# Patient Record
Sex: Male | Born: 1985 | Race: Black or African American | Hispanic: Refuse to answer | Marital: Married | Smoking: Current some day smoker
Health system: Southern US, Community
[De-identification: ages and names within clinical notes are randomized; demographics above are authoritative.]

## PROBLEM LIST (undated history)

## (undated) DIAGNOSIS — E119 Type 2 diabetes mellitus without complications: Secondary | ICD-10-CM

## (undated) DIAGNOSIS — Z8249 Family history of ischemic heart disease and other diseases of the circulatory system: Secondary | ICD-10-CM

## (undated) HISTORY — DX: Family history of ischemic heart disease and other diseases of the circulatory system: Z82.49

---
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
## 2004-06-11 ENCOUNTER — Emergency Department (HOSPITAL_COMMUNITY): Admission: EM | Admit: 2004-06-11 | Discharge: 2004-06-11 | Payer: Self-pay | Admitting: *Deleted

## 2005-06-05 ENCOUNTER — Emergency Department (HOSPITAL_COMMUNITY): Admission: EM | Admit: 2005-06-05 | Discharge: 2005-06-05 | Payer: Self-pay | Admitting: Emergency Medicine

## 2005-06-17 ENCOUNTER — Emergency Department (HOSPITAL_COMMUNITY): Admission: EM | Admit: 2005-06-17 | Discharge: 2005-06-17 | Payer: Self-pay | Admitting: Emergency Medicine

## 2005-10-15 ENCOUNTER — Emergency Department (HOSPITAL_COMMUNITY): Admission: EM | Admit: 2005-10-15 | Discharge: 2005-10-15 | Payer: Self-pay | Admitting: Emergency Medicine

## 2006-03-18 ENCOUNTER — Emergency Department (HOSPITAL_COMMUNITY): Admission: EM | Admit: 2006-03-18 | Discharge: 2006-03-18 | Payer: Self-pay | Admitting: Emergency Medicine

## 2006-03-20 ENCOUNTER — Emergency Department (HOSPITAL_COMMUNITY): Admission: EM | Admit: 2006-03-20 | Discharge: 2006-03-20 | Payer: Self-pay | Admitting: Emergency Medicine

## 2006-05-20 ENCOUNTER — Emergency Department (HOSPITAL_COMMUNITY): Admission: EM | Admit: 2006-05-20 | Discharge: 2006-05-20 | Payer: Self-pay | Admitting: Emergency Medicine

## 2006-06-04 ENCOUNTER — Encounter: Admission: RE | Admit: 2006-06-04 | Discharge: 2006-09-02 | Payer: Self-pay | Admitting: Family Medicine

## 2006-09-03 ENCOUNTER — Emergency Department (HOSPITAL_COMMUNITY): Admission: EM | Admit: 2006-09-03 | Discharge: 2006-09-03 | Payer: Self-pay | Admitting: Emergency Medicine

## 2009-02-10 ENCOUNTER — Ambulatory Visit: Payer: Self-pay | Admitting: Family Medicine

## 2009-07-15 ENCOUNTER — Ambulatory Visit: Payer: Self-pay | Admitting: Family Medicine

## 2009-08-19 ENCOUNTER — Ambulatory Visit: Payer: Self-pay | Admitting: Family Medicine

## 2009-12-05 ENCOUNTER — Ambulatory Visit: Payer: Self-pay | Admitting: Family Medicine

## 2010-02-06 ENCOUNTER — Ambulatory Visit: Payer: Self-pay | Admitting: Family Medicine

## 2011-01-17 ENCOUNTER — Emergency Department (HOSPITAL_COMMUNITY)
Admission: EM | Admit: 2011-01-17 | Discharge: 2011-01-17 | Payer: Self-pay | Source: Home / Self Care | Admitting: Emergency Medicine

## 2011-02-22 ENCOUNTER — Encounter (INDEPENDENT_AMBULATORY_CARE_PROVIDER_SITE_OTHER): Payer: BC Managed Care – PPO | Admitting: Family Medicine

## 2011-08-13 ENCOUNTER — Ambulatory Visit: Payer: BC Managed Care – PPO | Admitting: Family Medicine

## 2011-08-16 ENCOUNTER — Encounter: Payer: Self-pay | Admitting: Family Medicine

## 2011-08-20 ENCOUNTER — Ambulatory Visit: Payer: BC Managed Care – PPO | Admitting: Family Medicine

## 2012-06-16 ENCOUNTER — Telehealth: Payer: Self-pay | Admitting: Internal Medicine

## 2012-06-16 NOTE — Telephone Encounter (Signed)
At him know that I don't know anybody down there.

## 2012-06-17 NOTE — Telephone Encounter (Signed)
Called pt to let them know Dr.Lalonde dosent know and family physicians in [place] left message

## 2012-07-14 ENCOUNTER — Encounter: Payer: Self-pay | Admitting: Family Medicine

## 2012-07-14 ENCOUNTER — Ambulatory Visit (INDEPENDENT_AMBULATORY_CARE_PROVIDER_SITE_OTHER): Payer: BC Managed Care – PPO | Admitting: Family Medicine

## 2012-07-14 VITALS — BP 130/82 | HR 67 | Ht 74.0 in | Wt 268.0 lb

## 2012-07-14 DIAGNOSIS — Z0000 Encounter for general adult medical examination without abnormal findings: Secondary | ICD-10-CM

## 2012-07-14 DIAGNOSIS — E669 Obesity, unspecified: Secondary | ICD-10-CM

## 2012-07-14 LAB — POCT URINALYSIS DIPSTICK
Bilirubin, UA: NEGATIVE
Blood, UA: NEGATIVE
Glucose, UA: NEGATIVE
Ketones, UA: NEGATIVE
Leukocytes, UA: NEGATIVE
Nitrite, UA: NEGATIVE
Protein, UA: NEGATIVE
Spec Grav, UA: 1.015
Urobilinogen, UA: NEGATIVE
pH, UA: 6

## 2012-07-14 LAB — LIPID PANEL
Cholesterol: 194 mg/dL (ref 0–200)
HDL: 28 mg/dL — ABNORMAL LOW (ref >39)
LDL Cholesterol: 130 mg/dL — ABNORMAL HIGH (ref 0–99)
Total CHOL/HDL Ratio: 6.9 Ratio
Triglycerides: 180 mg/dL — ABNORMAL HIGH (ref <150)
VLDL: 36 mg/dL (ref 0–40)

## 2012-07-14 NOTE — Progress Notes (Signed)
  Subjective:    Patient ID: Shawn [name], male    DOB: 1986/12/25, 26 y.o.   MRN: [id number]  HPI He is here for complete examination. He is now living in [place] with his girlfriend and her 32 year old child. He did this to see how the relationship ago. He is not happy living in [place]. He is considering going back to school. Realizes that just making money is not the ideal situation. He also has concerns over the relationship and obviously has been weaned work on it since he has moved. He does occasionally smoke.   Review of Systems Negative except as above    Objective:   Physical Exam BP 130/82  Pulse 67  Ht 6\' 2"  (1.88 m)  Wt 268 lb (121.564 kg)  BMI 34.41 kg/m2  SpO2 97%  General Appearance:    Alert, cooperative, no distress, appears stated age  Head:    Normocephalic, without obvious abnormality, atraumatic  Eyes:    PERRL, conjunctiva/corneas clear, EOM's intact, fundi    benign  Ears:    Normal TM's and external ear canals  Nose:   Nares normal, mucosa normal, no drainage or sinus   tenderness  Throat:   Lips, mucosa, and tongue normal; teeth and gums normal  Neck:   Supple, no lymphadenopathy;  thyroid:  no   enlargement/tenderness/nodules; no carotid   bruit or JVD  Back:    Spine nontender, no curvature, ROM normal, no CVA     tenderness  Lungs:     Clear to auscultation bilaterally without wheezes, rales or     ronchi; respirations unlabored  Chest Wall:    No tenderness or deformity   Heart:    Regular rate and rhythm, S1 and S2 normal, no murmur, rub   or gallop  Breast Exam:    No chest wall tenderness, masses or gynecomastia  Abdomen:     Soft, non-tender, nondistended, normoactive bowel sounds,    no masses, no hepatosplenomegaly  Genitalia:    Normal male external genitalia without lesions.  Testicles without masses.  No inguinal hernias.  Rectal:   Deferred due to age <40 and lack of symptoms  Extremities:   No clubbing, cyanosis or  edema  Pulses:   2+ and symmetric all extremities  Skin:   Skin color, texture, turgor normal, no rashes or lesions  Lymph nodes:   Cervical, supraclavicular, and axillary nodes normal  Neurologic:   CNII-XII intact, normal strength, sensation and gait; reflexes 2+ and symmetric throughout          Psych:   Normal mood, affect, hygiene and grooming.           Assessment & Plan:   1. Routine general medical examination at a health care facility  POCT Urinalysis Dipstick, Lipid panel  2. Obesity (BMI 30-39.9)     encouraged him to quit smoking entirely. I also encouraged him to get involved in an exercise program. We discussed him going back to school and I strongly encouraged him to do this as well as get involved in counseling to help resolve some interpersonal issues he is dealing with. All his questions were answered.

## 2016-01-01 ENCOUNTER — Encounter (HOSPITAL_COMMUNITY): Payer: Self-pay | Admitting: Emergency Medicine

## 2016-01-01 ENCOUNTER — Emergency Department (HOSPITAL_COMMUNITY): Payer: BLUE CROSS/BLUE SHIELD

## 2016-01-01 ENCOUNTER — Emergency Department (HOSPITAL_COMMUNITY)
Admission: EM | Admit: 2016-01-01 | Discharge: 2016-01-02 | Disposition: A | Discharge status: Home / Self Care | Payer: BLUE CROSS/BLUE SHIELD | Attending: Emergency Medicine | Admitting: Emergency Medicine

## 2016-01-01 DIAGNOSIS — K219 Gastro-esophageal reflux disease without esophagitis: Secondary | ICD-10-CM

## 2016-01-01 DIAGNOSIS — E119 Type 2 diabetes mellitus without complications: Secondary | ICD-10-CM | POA: Insufficient documentation

## 2016-01-01 DIAGNOSIS — Z79899 Other long term (current) drug therapy: Secondary | ICD-10-CM | POA: Diagnosis not present

## 2016-01-01 DIAGNOSIS — F17210 Nicotine dependence, cigarettes, uncomplicated: Secondary | ICD-10-CM | POA: Insufficient documentation

## 2016-01-01 DIAGNOSIS — R079 Chest pain, unspecified: Secondary | ICD-10-CM | POA: Diagnosis present

## 2016-01-01 HISTORY — DX: Type 2 diabetes mellitus without complications: E11.9

## 2016-01-01 LAB — BASIC METABOLIC PANEL
ANION GAP: 9 (ref 5–15)
BUN: 13 mg/dL (ref 6–20)
CHLORIDE: 104 mmol/L (ref 101–111)
CO2: 26 mmol/L (ref 22–32)
Calcium: 9.6 mg/dL (ref 8.9–10.3)
Creatinine, Ser: 0.98 mg/dL (ref 0.61–1.24)
GFR calc non Af Amer: >60 mL/min (ref >60)
Glucose, Bld: 124 mg/dL — ABNORMAL HIGH (ref 65–99)
POTASSIUM: 3.8 mmol/L (ref 3.5–5.1)
SODIUM: 139 mmol/L (ref 135–145)

## 2016-01-01 LAB — CBC
HEMATOCRIT: 35.4 % — AB (ref 39.0–52.0)
HEMOGLOBIN: 11.7 g/dL — AB (ref 13.0–17.0)
MCH: 31.3 pg (ref 26.0–34.0)
MCHC: 33.1 g/dL (ref 30.0–36.0)
MCV: 94.7 fL (ref 78.0–100.0)
PLATELETS: 294 10*3/uL (ref 150–400)
RBC: 3.74 MIL/uL — AB (ref 4.22–5.81)
RDW: 14.2 % (ref 11.5–15.5)
WBC: 6.4 10*3/uL (ref 4.0–10.5)

## 2016-01-01 LAB — I-STAT TROPONIN, ED: Troponin i, poc: 0 ng/mL (ref 0.00–0.08)

## 2016-01-01 LAB — CBG MONITORING, ED: Glucose-Capillary: 129 mg/dL — ABNORMAL HIGH (ref 65–99)

## 2016-01-01 NOTE — ED Notes (Signed)
MD at bedside. 

## 2016-01-01 NOTE — ED Notes (Signed)
EKG given to lead tech.  Could not locate MD.

## 2016-01-01 NOTE — ED Notes (Signed)
Pt states he is having chest pain that started yesterday   Pt states it has been on and off and today it came back and he felt shortness of breath with it and has felt very fatigued today  Pt states on the way here he started having tightness behind his eyes

## 2016-01-01 NOTE — ED Provider Notes (Signed)
CSN: [id number]     Arrival date & time 01/01/16  2054 History  By signing my name below, I, Soijett Blue, attest that this documentation has been prepared under the direction and in the presence of Ravin Denardo, MD. Electronically Signed: Soijett Blue, ED Scribe. 01/01/2016. 11:26 PM.   Chief Complaint  Patient presents with  . Chest Pain      Patient is a 30 y.o. male presenting with chest pain. The history is provided by the patient. No language interpreter was used.  Chest Pain Pain location:  Unable to specify Pain quality: burning   Pain radiates to:  Does not radiate Pain radiates to the back: no   Pain severity:  Mild Onset quality:  Sudden Duration:  1 day Timing:  Intermittent Progression:  Unchanged Chronicity:  New Context: eating   Relieved by:  None tried Worsened by:  Nothing tried Ineffective treatments:  Certain positions Associated symptoms: no anorexia, no fever, no lower extremity edema, no orthopnea, no palpitations, no PND, no shortness of breath and no syncope   Risk factors: diabetes mellitus     HPI Comments: Shawn [name] is a 30 y.o. male with a medical hx of heart dx and DM, who presents to the Emergency Department complaining of intermittent CP onset yesterday. He notes that he was laying down when the CP began and he had had an hamburger prior to the CP episode.  He states that he was informed in the past that he had GERD which he took TUMS that alleviated his symptoms. He denies using TUMS for his present symptoms. He reports that he has been burping and that it tasted like acid. He denies leg swelling and any other symptoms. He denies any recent long travel.    Past Medical History  Diagnosis Date  . FHx: heart disease   . Diabetes mellitus without complication (HCC)    History reviewed. No pertinent past surgical history. Family History  Problem Relation Age of Onset  . Diabetes Father   . Hypertension Father    Social History   Substance Use Topics  . Smoking status: Current Some Day Smoker    Types: Cigarettes  . Smokeless tobacco: None  . Alcohol Use: No    Review of Systems  Constitutional: Negative for fever.  Respiratory: Negative for shortness of breath.   Cardiovascular: Positive for chest pain. Negative for palpitations, orthopnea, syncope and PND.  Gastrointestinal: Negative for anorexia.  All other systems reviewed and are negative.     Allergies  Review of patient's allergies indicates no known allergies.  Home Medications   Prior to Admission medications   Medication Sig Start Date End Date Taking? Authorizing Provider  glipiZIDE (GLUCOTROL) 10 MG tablet Take 10 mg by mouth 2 (two) times daily before a meal.   Yes Historical Provider, MD  metFORMIN (GLUCOPHAGE) 500 MG tablet Take 500 mg by mouth 2 (two) times daily. 12/24/15  Yes Historical Provider, MD   BP 128/80 mmHg  Pulse 74  Temp(Src) 98.4 F (36.9 C) (Oral)  Resp 25  Ht 6\' 1"  (1.854 m)  Wt 269 lb (122.018 kg)  BMI 35.50 kg/m2  SpO2 99% Physical Exam  Constitutional: He is oriented to person, place, and time. He appears well-developed and well-nourished. No distress.  HENT:  Head: Normocephalic and atraumatic.  Mouth/Throat: Oropharynx is clear and moist.  Eyes: EOM are normal. Pupils are equal, round, and reactive to light.  Neck: Neck supple.  Cardiovascular: Normal rate, regular  rhythm and normal heart sounds.  Exam reveals no gallop and no friction rub.   No murmur heard. Pulmonary/Chest: Effort normal and breath sounds normal. No respiratory distress. He has no wheezes. He has no rales.  Abdominal: Soft. Bowel sounds are increased. There is no tenderness.  Musculoskeletal: Normal range of motion.  No palpable cords. All compartments are soft.   Neurological: He is alert and oriented to person, place, and time.  Skin: Skin is warm and dry.  Psychiatric: He has a normal mood and affect. His behavior is normal.   Nursing note and vitals reviewed.   ED Course  Procedures (including critical care time) DIAGNOSTIC STUDIES: Oxygen Saturation is 98% on RA, nl by my interpretation.    COORDINATION OF CARE: 11:24 PM Discussed treatment plan with pt at bedside which includes labs, CXR, EKG, and pt agreed to plan.    Labs Review Labs Reviewed  BASIC METABOLIC PANEL - Abnormal; Notable for the following:    Glucose, Bld 124 (*)    All other components within normal limits  CBC - Abnormal; Notable for the following:    RBC 3.74 (*)    Hemoglobin 11.7 (*)    HCT 35.4 (*)    All other components within normal limits  CBG MONITORING, ED - Abnormal; Notable for the following:    Glucose-Capillary 129 (*)    All other components within normal limits  I-STAT TROPOININ, ED    Imaging Review Dg Chest 2 View  01/01/2016  CLINICAL DATA:  Central and left-sided chest pain, onset yesterday. EXAM: CHEST  2 VIEW COMPARISON:  None. FINDINGS: The cardiomediastinal contours are normal. The lungs are clear. Pulmonary vasculature is normal. No consolidation, pleural effusion, or pneumothorax. No acute osseous abnormalities are seen. IMPRESSION: No acute pulmonary process. Electronically Signed   By: Rubye Oaks M.D.   On: 01/01/2016 22:36   I have personally reviewed and evaluated these images and lab results as part of my medical decision-making.   EKG Interpretation   Date/Time:  Sunday January 01 2016 21:03:40 EST Ventricular Rate:  76 PR Interval:  172 QRS Duration: 94 QT Interval:  357 QTC Calculation: 401 R Axis:   15 Text Interpretation:  Sinus rhythm Confirmed by [facility]  MD, Cederick Broadnax  ([id number]) on 01/01/2016 11:05:04 PM        MDM   Final diagnoses:  None   PERC score negative wells 0, highly doubt PE  > 8 hours of continuous symptoms with normal EKG and troponin excludes ACS.  Symptoms consistent with GERD, better with meds.  GERD friendly diet.  Prilosec.  Strict chest pain return  precautions given.      I personally performed the services described in this documentation, which was scribed in my presence. The recorded information has been reviewed and is accurate.     Tanmay Halteman, MD 01/02/16 0100

## 2016-01-02 ENCOUNTER — Encounter (HOSPITAL_COMMUNITY): Payer: Self-pay | Admitting: Emergency Medicine

## 2016-01-02 MED ORDER — FAMOTIDINE 20 MG PO TABS
20.0000 mg | ORAL_TABLET | Freq: Once | ORAL | Status: AC
  Administered 2016-01-02: 20 mg via ORAL
  Filled 2016-01-02: qty 1

## 2016-01-02 MED ORDER — GI COCKTAIL ~~LOC~~
30.0000 mL | Freq: Once | ORAL | Status: AC
  Administered 2016-01-02: 30 mL via ORAL
  Filled 2016-01-02: qty 30

## 2016-01-02 MED ORDER — OMEPRAZOLE 20 MG PO CPDR
20.0000 mg | DELAYED_RELEASE_CAPSULE | Freq: Every day | ORAL | Status: DC
Start: 2016-01-02 — End: 2022-11-06

## 2016-09-25 IMAGING — CR DG CHEST 2V
2 series · 2 of 2 positions shown · non-contrast
Comparison: None.

CLINICAL DATA: Central and left-sided chest pain, onset yesterday.

EXAM:
CHEST  2 VIEW

[w chest pa]
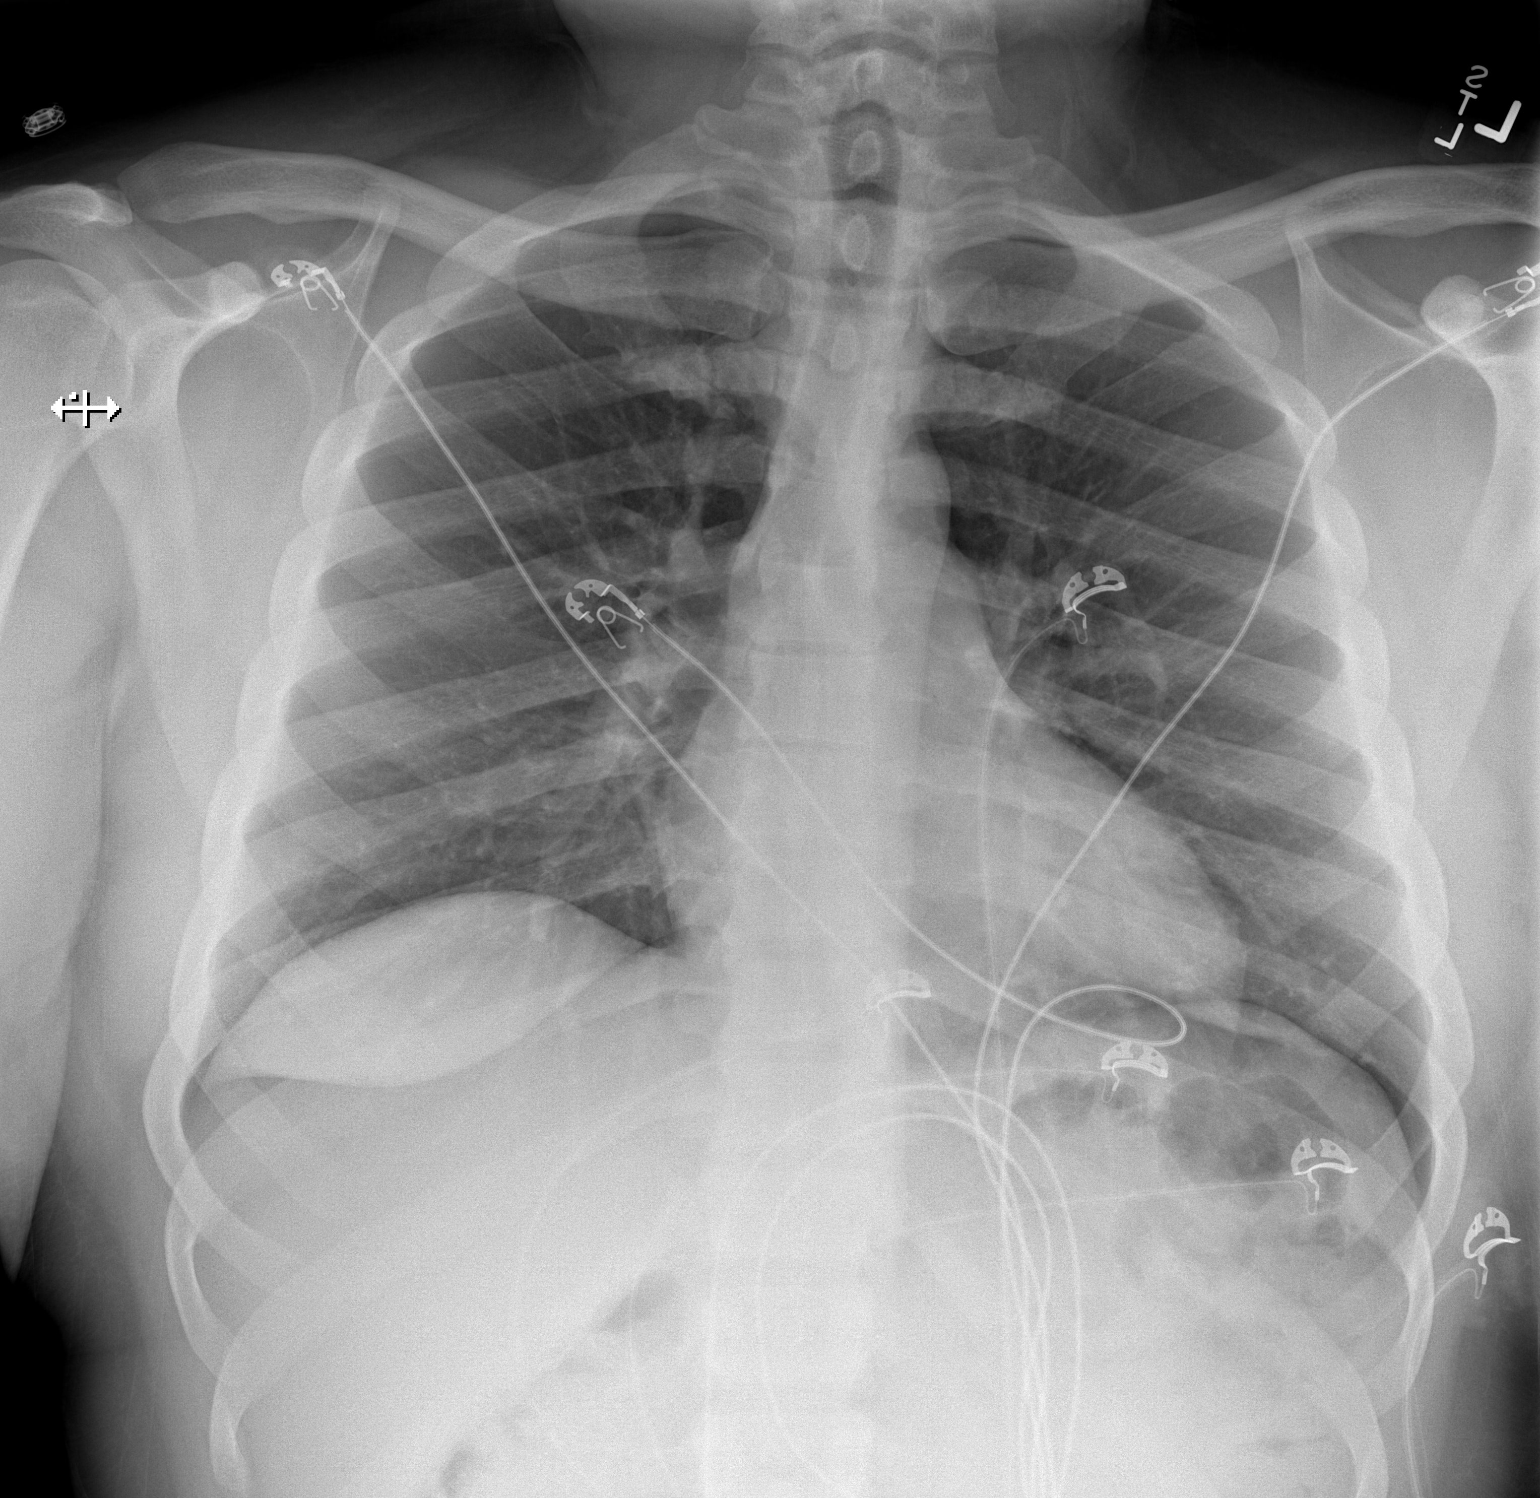

[w chest lat]
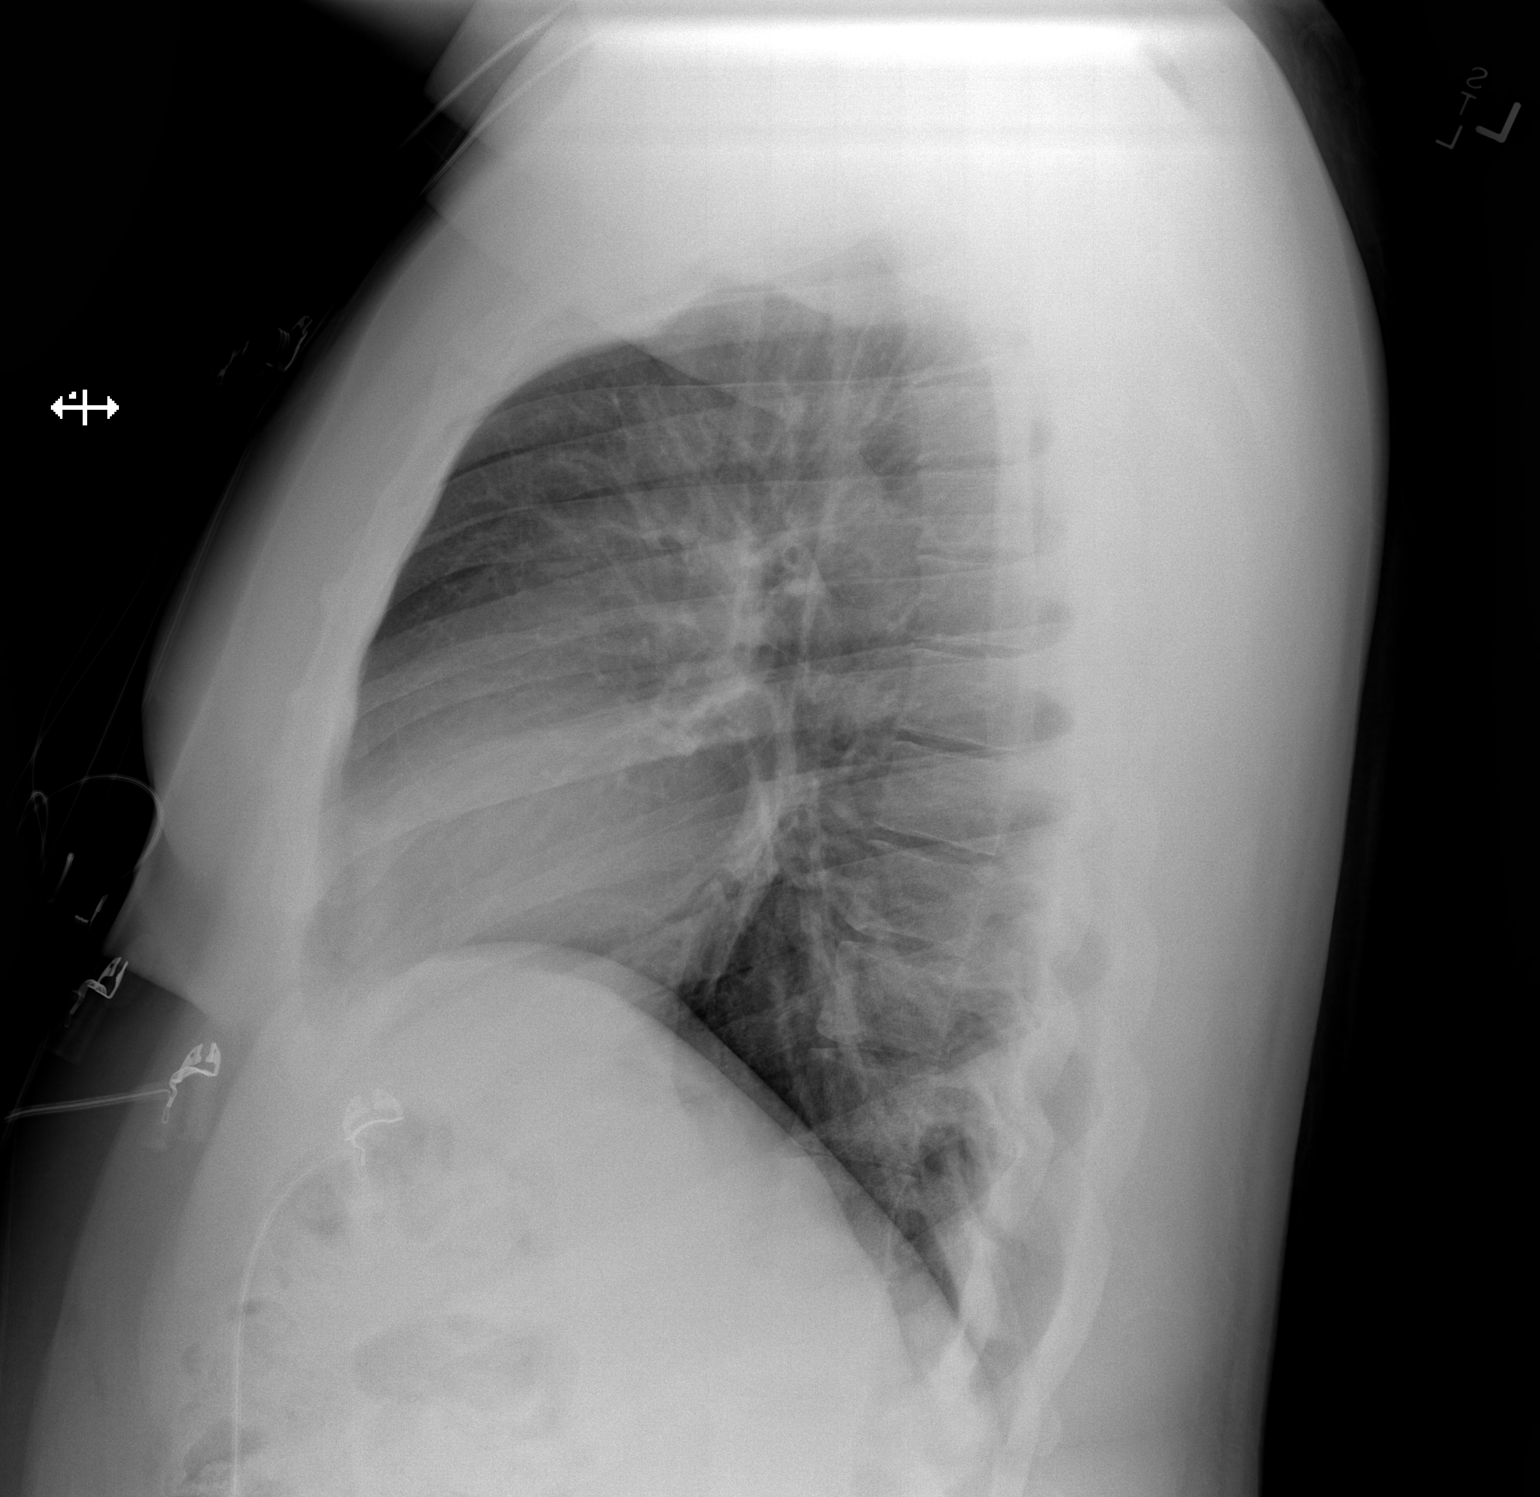

[2 of 2 positions shown; findings below may reference images not displayed]

FINDINGS: The cardiomediastinal contours are normal. The lungs are clear.
Pulmonary vasculature is normal. No consolidation, pleural effusion,
or pneumothorax. No acute osseous abnormalities are seen.
IMPRESSION: No acute pulmonary process.

## 2020-10-24 ENCOUNTER — Ambulatory Visit (HOSPITAL_COMMUNITY)
Admission: EM | Admit: 2020-10-24 | Discharge: 2020-10-24 | Disposition: A | Discharge status: Home / Self Care | Payer: No Typology Code available for payment source | Attending: Emergency Medicine | Admitting: Emergency Medicine

## 2020-10-24 ENCOUNTER — Other Ambulatory Visit: Payer: Self-pay

## 2020-10-24 ENCOUNTER — Encounter (HOSPITAL_COMMUNITY): Payer: Self-pay | Admitting: Emergency Medicine

## 2020-10-24 DIAGNOSIS — J029 Acute pharyngitis, unspecified: Secondary | ICD-10-CM | POA: Diagnosis not present

## 2020-10-24 DIAGNOSIS — Z7984 Long term (current) use of oral hypoglycemic drugs: Secondary | ICD-10-CM | POA: Insufficient documentation

## 2020-10-24 DIAGNOSIS — B349 Viral infection, unspecified: Secondary | ICD-10-CM | POA: Diagnosis not present

## 2020-10-24 DIAGNOSIS — R5383 Other fatigue: Secondary | ICD-10-CM | POA: Diagnosis not present

## 2020-10-24 DIAGNOSIS — R509 Fever, unspecified: Secondary | ICD-10-CM | POA: Diagnosis not present

## 2020-10-24 DIAGNOSIS — Z20822 Contact with and (suspected) exposure to covid-19: Secondary | ICD-10-CM | POA: Diagnosis not present

## 2020-10-24 DIAGNOSIS — Z79899 Other long term (current) drug therapy: Secondary | ICD-10-CM | POA: Insufficient documentation

## 2020-10-24 DIAGNOSIS — F17210 Nicotine dependence, cigarettes, uncomplicated: Secondary | ICD-10-CM | POA: Insufficient documentation

## 2020-10-24 DIAGNOSIS — R0602 Shortness of breath: Secondary | ICD-10-CM

## 2020-10-24 DIAGNOSIS — Z8616 Personal history of COVID-19: Secondary | ICD-10-CM | POA: Diagnosis not present

## 2020-10-24 DIAGNOSIS — E119 Type 2 diabetes mellitus without complications: Secondary | ICD-10-CM | POA: Insufficient documentation

## 2020-10-24 DIAGNOSIS — R6883 Chills (without fever): Secondary | ICD-10-CM | POA: Diagnosis not present

## 2020-10-24 DIAGNOSIS — Z794 Long term (current) use of insulin: Secondary | ICD-10-CM | POA: Insufficient documentation

## 2020-10-24 LAB — CBC WITH DIFFERENTIAL/PLATELET
Abs Immature Granulocytes: 0.06 10*3/uL (ref 0.00–0.07)
Basophils Absolute: 0 10*3/uL (ref 0.0–0.1)
Basophils Relative: 0 %
Eosinophils Absolute: 0 10*3/uL (ref 0.0–0.5)
Eosinophils Relative: 0 %
HCT: 39.8 % (ref 39.0–52.0)
Hemoglobin: 13.6 g/dL (ref 13.0–17.0)
Immature Granulocytes: 1 %
Lymphocytes Relative: 2 %
Lymphs Abs: 0.3 10*3/uL — ABNORMAL LOW (ref 0.7–4.0)
MCH: 30.8 pg (ref 26.0–34.0)
MCHC: 34.2 g/dL (ref 30.0–36.0)
MCV: 90.2 fL (ref 80.0–100.0)
Monocytes Absolute: 0.6 10*3/uL (ref 0.1–1.0)
Monocytes Relative: 4 %
Neutro Abs: 12 10*3/uL — ABNORMAL HIGH (ref 1.7–7.7)
Neutrophils Relative %: 93 %
Platelets: 280 10*3/uL (ref 150–400)
RBC: 4.41 MIL/uL (ref 4.22–5.81)
RDW: 11.9 % (ref 11.5–15.5)
WBC: 13 10*3/uL — ABNORMAL HIGH (ref 4.0–10.5)
nRBC: 0 % (ref 0.0–0.2)

## 2020-10-24 LAB — COMPREHENSIVE METABOLIC PANEL
ALT: 34 U/L (ref 0–44)
AST: 39 U/L (ref 15–41)
Albumin: 4.2 g/dL (ref 3.5–5.0)
Alkaline Phosphatase: 52 U/L (ref 38–126)
Anion gap: 15 (ref 5–15)
BUN: 9 mg/dL (ref 6–20)
CO2: 20 mmol/L — ABNORMAL LOW (ref 22–32)
Calcium: 9.5 mg/dL (ref 8.9–10.3)
Chloride: 98 mmol/L (ref 98–111)
Creatinine, Ser: 1.27 mg/dL — ABNORMAL HIGH (ref 0.61–1.24)
GFR, Estimated: >60 mL/min (ref >60)
Glucose, Bld: 303 mg/dL — ABNORMAL HIGH (ref 70–99)
Potassium: 4.2 mmol/L (ref 3.5–5.1)
Sodium: 133 mmol/L — ABNORMAL LOW (ref 135–145)
Total Bilirubin: 1.1 mg/dL (ref 0.3–1.2)
Total Protein: 6.6 g/dL (ref 6.5–8.1)

## 2020-10-24 LAB — POCT RAPID STREP A, ED / UC: Streptococcus, Group A Screen (Direct): NEGATIVE

## 2020-10-24 MED ORDER — IBUPROFEN 800 MG PO TABS: 800.0000 mg | ORAL_TABLET | Freq: Once | ORAL | Status: DC

## 2020-10-24 MED ORDER — ACETAMINOPHEN 325 MG PO TABS
650.0000 mg | ORAL_TABLET | Freq: Once | ORAL | Status: AC
  Administered 2020-10-24: 650 mg via ORAL

## 2020-10-24 MED ORDER — IBUPROFEN 800 MG PO TABS: 800.0000 mg | ORAL_TABLET | Freq: Three times a day (TID) | ORAL | 0 refills | Status: DC

## 2020-10-24 MED ORDER — ACETAMINOPHEN 325 MG PO TABS
ORAL_TABLET | ORAL | Status: AC
Start: 2020-10-24 — End: ?
  Filled 2020-10-24: qty 2

## 2020-10-24 NOTE — Discharge Instructions (Signed)
Strep test negative Test for Covid/RSV/flu pending Alternate Tylenol and ibuprofen every 4 hours for better control of fever/body aches and headache associated Rest and drink plenty of fluids  Follow-up if any symptoms not improving or worsening

## 2020-10-24 NOTE — ED Triage Notes (Signed)
Pt c/o bodyaches, fatigue, chills, sore throat onset today. Pt states his kids have been sick as well. Pt states he had covid at the end of July and had lingering symptoms up until about 2 weeks ago.

## 2020-10-25 LAB — RESP PANEL BY RT PCR (RSV, FLU A&B, COVID)
Influenza A by PCR: NEGATIVE
Influenza B by PCR: NEGATIVE
Respiratory Syncytial Virus by PCR: NEGATIVE
SARS Coronavirus 2 by RT PCR: NEGATIVE

## 2020-10-25 LAB — CULTURE, GROUP A STREP (THRC)

## 2020-10-25 NOTE — ED Provider Notes (Signed)
[facility]    CSN: [id number] Arrival date & time: 10/24/20  1025      History   Chief Complaint Chief Complaint  Patient presents with  . Sore Throat  . Fatigue  . Chills  . Shortness of Breath    HPI Shawn [name] is a 34 y.o. male history of DM type [name], presenting today for evaluation of fevers chills and body aches.  Patient reports that beginning today he all of a sudden felt under the weather and has had a lot of fatigue and body aches.  Is also reports sore throat.  Reports similar symptoms when he previously had Covid approximately 2 to 3 months ago.  Does report other family members have been sick recently as well.  He denies any chest pain shortness of breath, cough.  He does report some mild right abdominal pain and some diarrhea recently. Expresses concern over use of Metformin with concomitant alcohol use, concerned as he has not followed up with primary care in a while.  HPI  Past Medical History:  Diagnosis Date  . Diabetes mellitus without complication (HCC)   . FHx: heart disease     Patient Active Problem List   Diagnosis Date Noted  . Obesity (BMI 30-39.9) 07/14/2012    History reviewed. No pertinent surgical history.     Home Medications    Prior to Admission medications   Medication Sig Start Date End Date Taking? Authorizing Provider  insulin lispro (ADMELOG SOLOSTAR) 100 UNIT/ML KwikPen Inject into the skin. 08/06/20  Yes [provider]  Alogliptin-metFORMIN HCl 12.04-999 MG TABS TAKE 1 TABLET BY MOUTH TWICE DAILY WITH A MEAL 10/14/20   [provider]  glipiZIDE (GLUCOTROL) 10 MG tablet Take 10 mg by mouth 2 (two) times daily before a meal.    [provider]  ibuprofen (ADVIL) 800 MG tablet Take 1 tablet (800 mg total) by mouth 3 (three) times daily. 10/24/20   Gwenna Fuston C, PA-C  Insulin Glargine (BASAGLAR KWIKPEN) 100 UNIT/ML INJECT 30 UNITS UNDER THE SKIN TWO TIMES A DAY 07/06/20   [provider]  metFORMIN (GLUCOPHAGE) 500 MG tablet Take 500 mg by mouth 2 (two) times daily. 12/24/15   [provider]  omeprazole (PRILOSEC) 20 MG capsule Take 1 capsule (20 mg total) by mouth daily. 01/02/16   Palumbo, April, MD    Family History Family History  Problem Relation Age of Onset  . Diabetes Father   . Hypertension Father     Social History Social History   Tobacco Use  . Smoking status: Current Some Day Smoker    Types: Cigarettes  . Smokeless tobacco: Never Used  Substance Use Topics  . Alcohol use: No  . Drug use: No     Allergies   Patient has no known allergies.   Review of Systems Review of Systems  Constitutional: Positive for chills, fatigue and fever. Negative for activity change and appetite change.  HENT: Positive for sore throat. Negative for congestion, ear pain, rhinorrhea, sinus pressure and trouble swallowing.   Eyes: Negative for discharge and redness.  Respiratory: Negative for cough, chest tightness and shortness of breath.   Cardiovascular: Negative for chest pain.  Gastrointestinal: Negative for abdominal pain, diarrhea, nausea and vomiting.  Musculoskeletal: Positive for myalgias.  Skin: Negative for rash.  Neurological: Negative for dizziness, light-headedness and headaches.     Physical Exam Triage Vital Signs ED Triage Vitals  Enc Vitals Group  BP --      Pulse Rate 10/24/20 2000 (!) 112     Resp 10/24/20 2000 16     Temp 10/24/20 2000 (!) 102.8 F (39.3 C)     Temp Source 10/24/20 2000 Oral     SpO2 10/24/20 2000 100 %     Weight --      Height --      Head Circumference --      Peak Flow --      Pain Score 10/24/20 1957 10     Pain Loc --      Pain Edu? --      Excl. in GC? --    No data found.  Updated Vital Signs Pulse (!) 112   Temp (!) 102.8 F (39.3 C) (Oral)   Resp 16   SpO2 100%   Visual Acuity Right Eye Distance:   Left Eye Distance:   Bilateral Distance:    Right Eye Near:    Left Eye Near:    Bilateral Near:     Physical Exam Vitals and nursing note reviewed.  Constitutional:      Appearance: He is well-developed.     Comments: Slightly diaphoretic  HENT:     Head: Normocephalic and atraumatic.     Ears:     Comments: Bilateral ears without tenderness to palpation of external auricle, tragus and mastoid, EAC's without erythema or swelling, TM's with good bony landmarks and cone of light. Non erythematous.     Nose: Nose normal.     Mouth/Throat:     Comments: Oral mucosa pink and moist, no tonsillar enlargement or exudate. Posterior pharynx patent and nonerythematous, no uvula deviation or swelling. Normal phonation. Eyes:     Conjunctiva/sclera: Conjunctivae normal.  Cardiovascular:     Rate and Rhythm: Normal rate.  Pulmonary:     Effort: Pulmonary effort is normal. No respiratory distress.     Comments: Breathing comfortably at rest, CTABL, no wheezing, rales or other adventitious sounds auscultated Abdominal:     General: There is no distension.  Musculoskeletal:        General: Normal range of motion.     Cervical back: Neck supple.  Skin:    General: Skin is warm and dry.  Neurological:     Mental Status: He is alert and oriented to person, place, and time.      UC Treatments / Results  Labs (all labs ordered are listed, but only abnormal results are displayed) Labs Reviewed  CBC WITH DIFFERENTIAL/PLATELET - Abnormal; Notable for the following components:      Result Value   WBC 13.0 (*)    Neutro Abs 12.0 (*)    Lymphs Abs 0.3 (*)    All other components within normal limits  COMPREHENSIVE METABOLIC PANEL - Abnormal; Notable for the following components:   Sodium 133 (*)    CO2 20 (*)    Glucose, Bld 303 (*)    Creatinine, Ser 1.27 (*)    All other components within normal limits  CULTURE, GROUP A STREP ([facility])  RESP PANEL BY RT PCR (RSV, FLU A&B, COVID)  POCT RAPID STREP A, ED / UC    EKG   Radiology No results  found.  Procedures Procedures (including critical care time)  Medications Ordered in UC Medications  acetaminophen (TYLENOL) tablet 650 mg (650 mg Oral Given 10/24/20 2032)    Initial Impression / Assessment and Plan / UC Course  I have reviewed the triage vital signs  and the nursing notes.  Pertinent labs & imaging results that were available during my care of the patient were reviewed by me and considered in my medical decision making (see chart for details).     1 day of fever tachycardia; strep test negative, respiratory panel pending to be screened for Covid RSV and flu.  Lungs clear to auscultation and without cough or any lower respiratory symptoms, low suspicion of pneumonia.  Recommending treatment as viral illness with close monitoring over the next 24 to 48 hours.  Rest and fluids.  Checking CBC and CMP given patient's concern in relation to taking Metformin, will call with results if altering plan.  Otherwise follow-up with primary care as planned later this week.  Discussed strict return precautions. Patient verbalized understanding and is agreeable with plan.  Final Clinical Impressions(s) / UC Diagnoses   Final diagnoses:  Fever, unspecified  Fatigue, unspecified type  Viral illness     Discharge Instructions     Strep test negative Test for Covid/RSV/flu pending Alternate Tylenol and ibuprofen every 4 hours for better control of fever/body aches and headache associated Rest and drink plenty of fluids  Follow-up if any symptoms not improving or worsening   ED Prescriptions    Medication Sig Dispense Auth. Provider   ibuprofen (ADVIL) 800 MG tablet Take 1 tablet (800 mg total) by mouth 3 (three) times daily. 21 tablet Dawt Reeb, [place] C, PA-C     PDMP not reviewed this encounter.   Lew Dawes, [place] 10/25/20 [phone]

## 2020-10-27 LAB — CULTURE, GROUP A STREP (THRC)

## 2021-08-10 ENCOUNTER — Ambulatory Visit: Payer: No Typology Code available for payment source

## 2022-02-08 ENCOUNTER — Telehealth: Payer: Self-pay | Admitting: Emergency Medicine

## 2022-02-08 ENCOUNTER — Other Ambulatory Visit: Payer: Self-pay

## 2022-02-08 ENCOUNTER — Ambulatory Visit
Admission: EM | Admit: 2022-02-08 | Discharge: 2022-02-08 | Disposition: A | Discharge status: Home / Self Care | Payer: No Typology Code available for payment source | Attending: Emergency Medicine | Admitting: Emergency Medicine

## 2022-02-08 DIAGNOSIS — Z794 Long term (current) use of insulin: Secondary | ICD-10-CM

## 2022-02-08 DIAGNOSIS — I10 Essential (primary) hypertension: Secondary | ICD-10-CM

## 2022-02-08 DIAGNOSIS — E1159 Type 2 diabetes mellitus with other circulatory complications: Secondary | ICD-10-CM

## 2022-02-08 DIAGNOSIS — E1165 Type 2 diabetes mellitus with hyperglycemia: Secondary | ICD-10-CM

## 2022-02-08 MED ORDER — HYDROCHLOROTHIAZIDE 25 MG PO TABS: 25.0000 mg | ORAL_TABLET | Freq: Every morning | ORAL | 0 refills | Status: DC

## 2022-02-08 MED ORDER — HYDROCHLOROTHIAZIDE 25 MG PO TABS
25.0000 mg | ORAL_TABLET | Freq: Every morning | ORAL | 0 refills | Status: DC
Start: 2022-02-08 — End: 2022-02-08

## 2022-02-08 MED ORDER — METFORMIN HCL 500 MG PO TABS: 500.0000 mg | ORAL_TABLET | Freq: Two times a day (BID) | ORAL | 0 refills | Status: DC

## 2022-02-08 MED ORDER — LISINOPRIL 5 MG PO TABS: 5.0000 mg | ORAL_TABLET | Freq: Every evening | ORAL | 0 refills | Status: AC

## 2022-02-08 MED ORDER — GLIMEPIRIDE 4 MG PO TABS: 4.0000 mg | ORAL_TABLET | Freq: Every day | ORAL | 0 refills | Status: DC

## 2022-02-08 MED ORDER — TRESIBA FLEXTOUCH 200 UNIT/ML ~~LOC~~ SOPN: 60.0000 [IU] | PEN_INJECTOR | Freq: Every day | SUBCUTANEOUS | 2 refills | Status: AC

## 2022-02-08 MED ORDER — TRESIBA FLEXTOUCH 200 UNIT/ML ~~LOC~~ SOPN: 60.0000 [IU] | PEN_INJECTOR | Freq: Every day | SUBCUTANEOUS | 2 refills | Status: DC

## 2022-02-08 MED ORDER — LISINOPRIL 5 MG PO TABS
5.0000 mg | ORAL_TABLET | Freq: Every evening | ORAL | 0 refills | Status: DC
Start: 2022-02-08 — End: 2022-02-08

## 2022-02-08 MED ORDER — BD PEN NEEDLE MICRO U/F 32G X 6 MM MISC: 1.0000 [IU] | Freq: Every day | 2 refills | Status: DC

## 2022-02-08 NOTE — ED Triage Notes (Addendum)
Pt reports moving here recently and needing a PCP. Patient states he needs medication refills on medications for diabetes. Pt reports having blood glucose levels up to 400s. This morning he reports it was 347.

## 2022-02-08 NOTE — Telephone Encounter (Signed)
PAtient requested medications be sent to a different pharmacy, resent to one he requested and made it favorite in chart

## 2022-02-08 NOTE — Discharge Instructions (Addendum)
Please begin Tresiba, 60 units daily.  Please see below for instructions how to obtain $99 insulin card for this insulin.  This insulin is far superior to Lantus and you will find that you have much better sugar control throughout the course of your day.  It also means that you only had to inject yourself once.  I have renewed your prescription for metformin, please take 1 tablet twice daily with your meals.  I provided you with a coupon for this prescription.  Please discontinue glipizide and begin glimepiride, I believe this is a much more beneficial medication and it only needs to be taken once daily, I provided you with a coupon for this prescription.  For your blood pressure, please begin taking hydrochlorothiazide in the morning and lisinopril in the evening.  I provided you with coupons for both of these medications.  There is a great app that you to download to your phone, see below.  This will help you manage your diabetes gave you lots of great tips and advice and keep you motivated.  I have included the information for [facility] and adult and pediatric specialists, please reach out to them to schedule appointment as soon as possible for follow-up of your type 2 diabetes.  Stay motivated, stay focused on the future and get out of the blame game.  None of [place] are perfect, we can only strive to be.  [employer] Personalized Diabetes Management Platform   Pen, Smart Button, App ([email or web address])   If you are uninsured or have commercial insurance and want to register via text to receive your My$99Insulin Card, text [email or web address] to [phone] to sign up.

## 2022-02-08 NOTE — ED Provider Notes (Signed)
[facility]    CSN: [id number] Arrival date & time: 02/08/22  [id number]    HISTORY   Chief Complaint  Patient presents with   Medication Refill   HPI Shawn [name] is a 36 y.o. male. Pt reports moving here recently and needing a PCP. Patient states he needs medication refills on medications for diabetes. Pt reports having blood glucose levels up to 400s. This morning he reports it was 347.  Patient states his efforts to control his diabetes wax and wane.  Patient states he is out of all medications at this time.  Patient says he does not currently have any insurance.  Patient denies vision changes, headache, erectile dysfunction, gastroparesis, wounds to extremities.  The history is provided by the patient.  Past Medical History:  Diagnosis Date   Diabetes mellitus without complication (HCC)    FHx: heart disease    Patient Active Problem List   Diagnosis Date Noted   Obesity (BMI 30-39.9) 07/14/2012   History reviewed. No pertinent surgical history.  Home Medications    Prior to Admission medications   Medication Sig Start Date End Date Taking? Authorizing Provider  Insulin Pen Needle (BD PEN NEEDLE MICRO U/F) 32G X 6 MM MISC 1 Units by Does not apply route daily. 02/08/22 05/19/22 Yes Theadora RamaMorgan, Abhi Moccia Scales, PA-C  glimepiride (AMARYL) 4 MG tablet Take 1 tablet (4 mg total) by mouth daily with breakfast. 02/08/22 05/09/22  Theadora RamaMorgan, Kailany Dinunzio Scales, PA-C  glipiZIDE (GLUCOTROL) 10 MG tablet Take 10 mg by mouth 2 (two) times daily before a meal.    [provider]  hydrochlorothiazide (HYDRODIURIL) 25 MG tablet Take 1 tablet (25 mg total) by mouth in the morning. 02/08/22 05/09/22  Theadora RamaMorgan, Dashia Caldeira Scales, PA-C  ibuprofen (ADVIL) 800 MG tablet Take 1 tablet (800 mg total) by mouth 3 (three) times daily. 10/24/20   Wieters, Hallie C, PA-C  insulin degludec (TRESIBA FLEXTOUCH) 200 UNIT/ML FlexTouch Pen Inject 60 Units into the skin daily before breakfast. 02/08/22 05/09/22   Theadora RamaMorgan, Arvind Mexicano Scales, PA-C  lisinopril (ZESTRIL) 5 MG tablet Take 1 tablet (5 mg total) by mouth at bedtime. 02/08/22 05/09/22  Theadora RamaMorgan, Tyrus Wilms Scales, PA-C  metFORMIN (GLUCOPHAGE) 500 MG tablet Take 1 tablet (500 mg total) by mouth 2 (two) times daily with a meal. 02/08/22 05/09/22  Theadora RamaMorgan, Lexy Meininger Scales, PA-C  omeprazole (PRILOSEC) 20 MG capsule Take 1 capsule (20 mg total) by mouth daily. 01/02/16   Palumbo, April, MD    Family History Family History  Problem Relation Age of Onset   Diabetes Father    Hypertension Father    Social History Social History   Tobacco Use   Smoking status: Some Days    Types: Cigarettes   Smokeless tobacco: Never  Substance Use Topics   Alcohol use: No   Drug use: No   Allergies   Patient has no known allergies.  Review of Systems Review of Systems Pertinent findings noted in history of present illness.   Physical Exam Triage Vital Signs ED Triage Vitals  Enc Vitals Group     BP 10/27/21 0827 (!) 147/82     Pulse Rate 10/27/21 0827 72     Resp 10/27/21 0827 18     Temp 10/27/21 0827 98.3 F (36.8 C)     Temp Source 10/27/21 0827 Oral     SpO2 10/27/21 0827 98 %     Weight --      Height --      Head Circumference --  Peak Flow --      Pain Score 10/27/21 0826 5     Pain Loc --      Pain Edu? --      Excl. in GC? --   No data found.  Updated Vital Signs BP (!) 130/100 (BP Location: Right Arm)    Pulse 71    Temp 98.8 F (37.1 C) (Oral)    Resp 20    Wt 255 lb 9.6 oz (115.9 kg)    SpO2 97%    BMI 33.72 kg/m   Physical Exam Vitals and nursing note reviewed.  Constitutional:      General: He is not in acute distress.    Appearance: Normal appearance. He is not ill-appearing.  HENT:     Head: Normocephalic and atraumatic.     Salivary Glands: Right salivary gland is not diffusely enlarged or tender. Left salivary gland is not diffusely enlarged or tender.     Right Ear: Tympanic membrane, ear canal and external ear normal. No  drainage. No middle ear effusion. There is no impacted cerumen. Tympanic membrane is not erythematous or bulging.     Left Ear: Tympanic membrane, ear canal and external ear normal. No drainage.  No middle ear effusion. There is no impacted cerumen. Tympanic membrane is not erythematous or bulging.     Nose: Nose normal. No nasal deformity, septal deviation, mucosal edema, congestion or rhinorrhea.     Right Turbinates: Not enlarged, swollen or pale.     Left Turbinates: Not enlarged, swollen or pale.     Right Sinus: No maxillary sinus tenderness or frontal sinus tenderness.     Left Sinus: No maxillary sinus tenderness or frontal sinus tenderness.     Mouth/Throat:     Lips: Pink. No lesions.     Mouth: Mucous membranes are moist. No oral lesions.     Pharynx: Oropharynx is clear. Uvula midline. No posterior oropharyngeal erythema or uvula swelling.     Tonsils: No tonsillar exudate. 0 on the right. 0 on the left.  Eyes:     General: Lids are normal.        Right eye: No discharge.        Left eye: No discharge.     Extraocular Movements: Extraocular movements intact.     Conjunctiva/sclera: Conjunctivae normal.     Right eye: Right conjunctiva is not injected.     Left eye: Left conjunctiva is not injected.  Neck:     Trachea: Trachea and phonation normal.  Cardiovascular:     Rate and Rhythm: Normal rate and regular rhythm.     Pulses: Normal pulses.     Heart sounds: Normal heart sounds. No murmur heard.   No friction rub. No gallop.  Pulmonary:     Effort: Pulmonary effort is normal. No accessory muscle usage, prolonged expiration or respiratory distress.     Breath sounds: Normal breath sounds. No stridor, decreased air movement or transmitted upper airway sounds. No decreased breath sounds, wheezing, rhonchi or rales.  Chest:     Chest wall: No tenderness.  Abdominal:     General: Abdomen is flat. Bowel sounds are normal.     Palpations: Abdomen is soft.  Musculoskeletal:         General: Normal range of motion.     Cervical back: Normal range of motion and neck supple. Normal range of motion.  Lymphadenopathy:     Cervical: No cervical adenopathy.  Skin:    General: Skin  is warm and dry.     Findings: No erythema or rash.  Neurological:     General: No focal deficit present.     Mental Status: He is alert and oriented to person, place, and time.  Psychiatric:        Mood and Affect: Mood normal.        Behavior: Behavior normal.    Visual Acuity Right Eye Distance:   Left Eye Distance:   Bilateral Distance:    Right Eye Near:   Left Eye Near:    Bilateral Near:     UC Couse / Diagnostics / Procedures:    EKG  Radiology No results found.  Procedures Procedures (including critical care time)  UC Diagnoses / Final Clinical Impressions(s)   I have reviewed the triage vital signs and the nursing notes.  Pertinent labs & imaging results that were available during my care of the patient were reviewed by me and considered in my medical decision making (see chart for details).    Final diagnoses:  Type 2 diabetes mellitus with hyperglycemia, with long-term current use of insulin (HCC)  Essential hypertension   Patient provided with renewal of his diabetes medications along with antihypertensive medications which have not been prescribed for him to date.  Education provided regarding type 2 diabetes, side effects of poorly controlled diabetes and current diabetes guidelines.  Patient provided with a app for his phone to help keep him motivated and give him good ideas about how to manage his type 2 diabetes.  Return precautions advised.  Patient provided with information about a local federally qualified Health Center who will see him without health insurance.  ED Prescriptions     Medication Sig Dispense Auth. Provider   metFORMIN (GLUCOPHAGE) 500 MG tablet Take 1 tablet (500 mg total) by mouth 2 (two) times daily with a meal. 180 tablet Theadora Rama Scales, PA-C   insulin degludec (TRESIBA FLEXTOUCH) 200 UNIT/ML FlexTouch Pen Inject 60 Units into the skin daily before breakfast. 1,800 mL Theadora Rama Scales, PA-C   Insulin Pen Needle (BD PEN NEEDLE MICRO U/F) 32G X 6 MM MISC 1 Units by Does not apply route daily. 100 each Theadora Rama Scales, PA-C   glimepiride (AMARYL) 4 MG tablet Take 1 tablet (4 mg total) by mouth daily with breakfast. 90 tablet Theadora Rama Scales, PA-C   hydrochlorothiazide (HYDRODIURIL) 25 MG tablet Take 1 tablet (25 mg total) by mouth in the morning. 90 tablet Theadora Rama Scales, PA-C   lisinopril (ZESTRIL) 5 MG tablet Take 1 tablet (5 mg total) by mouth at bedtime. 90 tablet Theadora Rama Scales, [place]      PDMP not reviewed this encounter.  Pending results:  Labs Reviewed - No data to display  Medications Ordered in UC: Medications - No data to display  Disposition Upon Discharge:  Condition: stable for discharge home Home: take medications as prescribed; routine discharge instructions as discussed; follow up as advised.  Patient presented with an acute illness with associated systemic symptoms and significant discomfort requiring urgent management. In my opinion, this is a condition that a prudent lay person (someone who possesses an average knowledge of health and medicine) may potentially expect to result in complications if not addressed urgently such as respiratory distress, impairment of bodily function or dysfunction of bodily organs.   Routine symptom specific, illness specific and/or disease specific instructions were discussed with the patient and/or caregiver at length.   As such, the patient has been evaluated and  assessed, work-up was performed and treatment was provided in alignment with urgent care protocols and evidence based medicine.  Patient/parent/caregiver has been advised that the patient may require follow up for further testing and treatment if the symptoms continue  in spite of treatment, as clinically indicated and appropriate.  If the patient was tested for COVID-19, Influenza and/or RSV, then the patient/parent/guardian was advised to isolate at home pending the results of his/her diagnostic coronavirus test and potentially longer if theyre positive. I have also advised pt that if his/her COVID-19 test returns positive, it's recommended to self-isolate for at least 10 days after symptoms first appeared AND until fever-free for 24 hours without fever reducer AND other symptoms have improved or resolved. Discussed self-isolation recommendations as well as instructions for household member/close contacts as per the [facility] and [place] DHHS, and also gave patient the COVID packet with this information.  Patient/parent/caregiver has been advised to return to the [facility] or PCP in 3-5 days if no better; to PCP or the Emergency Department if new signs and symptoms develop, or if the current signs or symptoms continue to change or worsen for further workup, evaluation and treatment as clinically indicated and appropriate  The patient will follow up with their current PCP if and as advised. If the patient does not currently have a PCP we will assist them in obtaining one.   The patient may need specialty follow up if the symptoms continue, in spite of conservative treatment and management, for further workup, evaluation, consultation and treatment as clinically indicated and appropriate.   Patient/parent/caregiver verbalized understanding and agreement of plan as discussed.  All questions were addressed during visit.  Please see discharge instructions below for further details of plan.  Discharge Instructions:   Discharge Instructions      Please begin Tresiba, 60 units daily.  Please see below for instructions how to obtain $99 insulin card for this insulin.  This insulin is far superior to Lantus and you will find that you have much better sugar control throughout the course  of your day.  It also means that you only had to inject yourself once.  I have renewed your prescription for metformin, please take 1 tablet twice daily with your meals.  I provided you with a coupon for this prescription.  Please discontinue glipizide and begin glimepiride, I believe this is a much more beneficial medication and it only needs to be taken once daily, I provided you with a coupon for this prescription.  For your blood pressure, please begin taking hydrochlorothiazide in the morning and lisinopril in the evening.  I provided you with coupons for both of these medications.  There is a great app that you to download to your phone, see below.  This will help you manage your diabetes gave you lots of great tips and advice and keep you motivated.  I have included the information for [facility] and adult and pediatric specialists, please reach out to them to schedule appointment as soon as possible for follow-up of your type 2 diabetes.  Stay motivated, stay focused on the future and get out of the blame game.  None of [place] are perfect, we can only strive to be.  [employer] Personalized Diabetes Management Platform   Pen, Smart Button, App ([email or web address])   If you are uninsured or have commercial insurance and want to register via text to receive your My$99Insulin Card, text [email or web address] to [phone] to sign up.     This office note has been  dictated using Teaching laboratory technicianDragon speech recognition software.  Unfortunately, and despite my best efforts, this method of dictation can sometimes lead to occasional typographical or grammatical errors.  I apologize in advance if this occurs.     Theadora RamaMorgan, Kammy Klett Scales, PA-C 02/08/22 1251

## 2022-06-25 ENCOUNTER — Telehealth: Payer: Self-pay | Admitting: Family Medicine

## 2022-06-25 NOTE — Telephone Encounter (Signed)
Received rf request for tresiba from [facility]. Pt seen here 2/9, and was to establish with health center. We will fax back RF request with refusal. He needs to be seen here again if we are to do rx more for the tresiba, as we are an urgent care, and not his primary care clinic

## 2022-07-23 ENCOUNTER — Telehealth: Payer: Self-pay

## 2022-07-23 NOTE — Telephone Encounter (Signed)
Pt. Wife called wanting to schedule an apt. With you she stated he used to come here when he was younger. Looks like last apt here was in 2013. I told her I would have to check with you first before scheduling him an apt. Here.

## 2022-07-31 ENCOUNTER — Encounter (HOSPITAL_BASED_OUTPATIENT_CLINIC_OR_DEPARTMENT_OTHER): Payer: Self-pay | Admitting: Emergency Medicine

## 2022-07-31 ENCOUNTER — Emergency Department (HOSPITAL_BASED_OUTPATIENT_CLINIC_OR_DEPARTMENT_OTHER): Payer: Self-pay

## 2022-07-31 ENCOUNTER — Emergency Department (HOSPITAL_BASED_OUTPATIENT_CLINIC_OR_DEPARTMENT_OTHER)
Admission: EM | Admit: 2022-07-31 | Discharge: 2022-07-31 | Disposition: A | Discharge status: Home / Self Care | Payer: Self-pay | Attending: Emergency Medicine | Admitting: Emergency Medicine

## 2022-07-31 ENCOUNTER — Other Ambulatory Visit: Payer: Self-pay

## 2022-07-31 DIAGNOSIS — R1032 Left lower quadrant pain: Secondary | ICD-10-CM

## 2022-07-31 DIAGNOSIS — K529 Noninfective gastroenteritis and colitis, unspecified: Secondary | ICD-10-CM | POA: Insufficient documentation

## 2022-07-31 DIAGNOSIS — R739 Hyperglycemia, unspecified: Secondary | ICD-10-CM

## 2022-07-31 DIAGNOSIS — E1165 Type 2 diabetes mellitus with hyperglycemia: Secondary | ICD-10-CM | POA: Insufficient documentation

## 2022-07-31 DIAGNOSIS — Z7984 Long term (current) use of oral hypoglycemic drugs: Secondary | ICD-10-CM | POA: Insufficient documentation

## 2022-07-31 LAB — URINALYSIS, ROUTINE W REFLEX MICROSCOPIC
Bilirubin Urine: NEGATIVE
Glucose, UA: NEGATIVE mg/dL
Hgb urine dipstick: NEGATIVE
Ketones, ur: NEGATIVE mg/dL
Leukocytes,Ua: NEGATIVE
Nitrite: NEGATIVE
Protein, ur: NEGATIVE mg/dL
Specific Gravity, Urine: 1.017 (ref 1.005–1.030)
pH: 5.5 (ref 5.0–8.0)

## 2022-07-31 LAB — CBC
HCT: 45.4 % (ref 39.0–52.0)
Hemoglobin: 15.8 g/dL (ref 13.0–17.0)
MCH: 29.9 pg (ref 26.0–34.0)
MCHC: 34.8 g/dL (ref 30.0–36.0)
MCV: 86 fL (ref 80.0–100.0)
Platelets: 301 10*3/uL (ref 150–400)
RBC: 5.28 MIL/uL (ref 4.22–5.81)
RDW: 11.9 % (ref 11.5–15.5)
WBC: 7 10*3/uL (ref 4.0–10.5)
nRBC: 0 % (ref 0.0–0.2)

## 2022-07-31 LAB — CBG MONITORING, ED: Glucose-Capillary: 216 mg/dL — ABNORMAL HIGH (ref 70–99)

## 2022-07-31 LAB — BASIC METABOLIC PANEL
Anion gap: 8 (ref 5–15)
BUN: 11 mg/dL (ref 6–20)
CO2: 27 mmol/L (ref 22–32)
Calcium: 9 mg/dL (ref 8.9–10.3)
Chloride: 99 mmol/L (ref 98–111)
Creatinine, Ser: 1.04 mg/dL (ref 0.61–1.24)
GFR, Estimated: >60 mL/min (ref >60)
Glucose, Bld: 207 mg/dL — ABNORMAL HIGH (ref 70–99)
Potassium: 4.1 mmol/L (ref 3.5–5.1)
Sodium: 134 mmol/L — ABNORMAL LOW (ref 135–145)

## 2022-07-31 MED ORDER — IOHEXOL 300 MG/ML  SOLN
100.0000 mL | Freq: Once | INTRAMUSCULAR | Status: AC | PRN
  Administered 2022-07-31: 100 mL via INTRAVENOUS

## 2022-07-31 MED ORDER — SODIUM CHLORIDE 0.9 % IV BOLUS
1000.0000 mL | Freq: Once | INTRAVENOUS | Status: AC
  Administered 2022-07-31: 1000 mL via INTRAVENOUS

## 2022-07-31 NOTE — ED Triage Notes (Addendum)
Pt reports his blood sugar as been running in the 400 range at home x a few weeks; reports he takes Metformin at home and has not missed any doses, reports he used to take insulin but was taken off, pt reports feeling weak, thirsty, abd pain and feeling as if may pass out today

## 2022-07-31 NOTE — Discharge Instructions (Addendum)
You were seen in the emergency department today for abdominal pain and elevated blood sugar.  As we discussed your lab work looked reassuring today.  Your CT scan showed findings of enteritis, more commonly known as a stomach bug.  The best way to treat this is with good hydration and bland diet.  Continue to monitor how you're doing and return to the ER for new or worsening symptoms such as fevers, persistent vomiting or diarrhea, or inability to keep any food down.

## 2022-07-31 NOTE — ED Provider Notes (Signed)
[facility] EMERGENCY DEPT Provider Note   CSN: [id number] Arrival date & time: 07/31/22  1939     History  Chief Complaint  Patient presents with   Hyperglycemia    Shawn [name] is a 36 y.o. male who presents the emergency department complaining of weakness, increased thirst, abdominal pain, and lightheadedness starting today.  Patient states he got home from his overnight shift and felt increasingly fatigued.  He started having some cramping in the left side of his abdomen, and required some Pepto-Bismol in order to have a bowel movement.  Symptoms slightly improved after using the restroom, but have persisted.  Is also been feeling more short of breath, and generally weak.  Did not notice any blood in his stool.  Normal urination.  States the pain feels like cramping and does not radiate anywhere.  Patient states that he has been monitoring his blood sugar and its been running in the 400s at home for the past few weeks.  He does take metformin, used to be on insulin but had moved away and ran out of his prescription.  Has a follow-up appointment with his primary care doctor next week.   Hyperglycemia Associated symptoms: abdominal pain, increased thirst and weakness        Home Medications Prior to Admission medications   Medication Sig Start Date End Date Taking? Authorizing Provider  glimepiride (AMARYL) 4 MG tablet Take 1 tablet (4 mg total) by mouth daily with breakfast. 02/08/22 05/09/22  Theadora Rama Scales, PA-C  glipiZIDE (GLUCOTROL) 10 MG tablet Take 10 mg by mouth 2 (two) times daily before a meal.    [provider]  hydrochlorothiazide (HYDRODIURIL) 25 MG tablet Take 1 tablet (25 mg total) by mouth in the morning. 02/08/22 05/09/22  Theadora Rama Scales, PA-C  ibuprofen (ADVIL) 800 MG tablet Take 1 tablet (800 mg total) by mouth 3 (three) times daily. 10/24/20   Wieters, Hallie C, PA-C  lisinopril (ZESTRIL) 5 MG tablet Take 1 tablet (5 mg  total) by mouth at bedtime. 02/08/22 05/09/22  Theadora Rama Scales, PA-C  metFORMIN (GLUCOPHAGE) 500 MG tablet Take 1 tablet (500 mg total) by mouth 2 (two) times daily with a meal. 02/08/22 05/09/22  Theadora Rama Scales, PA-C  omeprazole (PRILOSEC) 20 MG capsule Take 1 capsule (20 mg total) by mouth daily. 01/02/16   Palumbo, April, MD      Allergies    Patient has no known allergies.    Review of Systems   Review of Systems  Gastrointestinal:  Positive for abdominal pain.  Endocrine: Positive for polydipsia.  Neurological:  Positive for weakness and light-headedness.  All other systems reviewed and are negative.   Physical Exam Updated Vital Signs BP (!) 142/92 (BP Location: Right Arm)   Pulse 91   Temp 99.4 F (37.4 C) (Oral)   Resp 17   Ht 6\' 1"  (1.854 m)   Wt 115.7 kg   SpO2 96%   BMI 33.64 kg/m  Physical Exam Vitals and nursing note reviewed.  Constitutional:      Appearance: Normal appearance.  HENT:     Head: Normocephalic and atraumatic.  Eyes:     Conjunctiva/sclera: Conjunctivae normal.  Cardiovascular:     Rate and Rhythm: Normal rate and regular rhythm.  Pulmonary:     Effort: Pulmonary effort is normal. No respiratory distress.     Breath sounds: Normal breath sounds.  Abdominal:     General: There is no distension.  Palpations: Abdomen is soft.     Tenderness: There is abdominal tenderness in the left upper quadrant and left lower quadrant. There is no guarding.  Skin:    General: Skin is warm and dry.  Neurological:     General: No focal deficit present.     Mental Status: He is alert.     ED Results / Procedures / Treatments   Labs (all labs ordered are listed, but only abnormal results are displayed) Labs Reviewed  BASIC METABOLIC PANEL - Abnormal; Notable for the following components:      Result Value   Sodium 134 (*)    Glucose, Bld 207 (*)    All other components within normal limits  CBG MONITORING, ED - Abnormal; Notable for the  following components:   Glucose-Capillary 216 (*)    All other components within normal limits  CBC  URINALYSIS, ROUTINE W REFLEX MICROSCOPIC    EKG EKG Interpretation  Date/Time:  Tuesday July 31 2022 20:10:05 EDT Ventricular Rate:  94 PR Interval:  173 QRS Duration: 93 QT Interval:  329 QTC Calculation: 412 R Axis:   31 Text Interpretation: Sinus rhythm Low voltage, precordial leads Consider anterior infarct No significant change since last tracing Confirmed by Dorie Rank [phone]) on 07/31/2022 8:43:32 PM  Radiology CT ABDOMEN PELVIS W CONTRAST  Result Date: 07/31/2022 CLINICAL DATA:  Left lower quadrant abdominal pain. EXAM: CT ABDOMEN AND PELVIS WITH CONTRAST TECHNIQUE: Multidetector CT imaging of the abdomen and pelvis was performed using the standard protocol following bolus administration of intravenous contrast. RADIATION DOSE REDUCTION: This exam was performed according to the departmental dose-optimization program which includes automated exposure control, adjustment of the mA and/or kV according to patient size and/or use of iterative reconstruction technique. CONTRAST:  168mL OMNIPAQUE IOHEXOL 300 MG/ML  SOLN COMPARISON:  None Available. FINDINGS: Lower chest: Minimal atelectasis at the right lung base. Hepatobiliary: No focal liver abnormality is seen. No gallstones, gallbladder wall thickening, or biliary dilatation. Pancreas: Unremarkable. No pancreatic ductal dilatation or surrounding inflammatory changes. Spleen: Normal in size without focal abnormality. Adrenals/Urinary Tract: No adrenal nodule or mass. The kidneys enhance symmetrically. No renal or ureteral calculus or hydronephrosis. The bladder is unremarkable. Stomach/Bowel: The stomach is distended with ingested debris. There is bowel wall thickening involving the duodenum and the jejunum in the mid left abdomen. No bowel obstruction, free air, or pneumatosis. A few scattered diverticula are present along the colon without  evidence of diverticulitis. A normal appendix is present in the right lower quadrant. Vascular/Lymphatic: Aortic atherosclerosis. No enlarged abdominal or pelvic lymph nodes. Reproductive: Prostate is unremarkable. Other: Small fat containing left inguinal hernia. No abdominopelvic ascites. Musculoskeletal: No acute osseous abnormality. There is a lytic lesion in the acetabulum on the right with a nonaggressive appearance. IMPRESSION: 1. Bowel wall thickening involving the duodenum and jejunum in the mid left abdomen suggesting enteritis. No bowel obstruction or free air. 2. Diverticulosis without diverticulitis. 3. Aortic atherosclerosis. Electronically Signed   By: Brett Fairy M.D.   On: 07/31/2022 21:38    Procedures Procedures    Medications Ordered in ED Medications  sodium chloride 0.9 % bolus 1,000 mL (1,000 mLs Intravenous New Bag/Given 07/31/22 2050)  iohexol (OMNIPAQUE) 300 MG/ML solution 100 mL (100 mLs Intravenous Contrast Given 07/31/22 2122)    ED Course/ Medical Decision Making/ A&P                           Medical  Decision Making Amount and/or Complexity of Data Reviewed Labs: ordered.   This patient is a 36 y.o. male  who presents to the ED for concern of abdominal pain, fatigue.   Differential diagnoses prior to evaluation: The emergent differential diagnosis includes, but is not limited to,  AAA, mesenteric ischemia, appendicitis, diverticulitis, DKA, gastritis/gastroenteritis, nephrolithiasis, pancreatitis, constipation, UTI, bowel obstruction, biliary disease, IBD, PUD, hepatitis. This is not an exhaustive differential.   Past Medical History / Co-morbidities: Diabetes  Physical Exam: Physical exam performed. The pertinent findings include: Hypertensive to 142/92, otherwise normal vital signs. LUQ and LLQ tenderness to palpation without guarding.   Lab Tests/Imaging studies: I personally interpreted labs/imaging and the pertinent results include: No leukocytosis,  normal hemoglobin.  Sodium 134.  Glucose 207.  Otherwise normal electrolytes normal kidney function.  Urinalysis negative for hematuria, glucose, ketones, or infection.  CT abdomen/pelvis showed bowel wall thickening suggestive of enteritis.I agree with the radiologist interpretation.  Cardiac monitoring: EKG obtained and interpreted by my attending physician which shows: sinus rhythm   Medications: I ordered medication including IV fluids.  I have reviewed the patients home medicines and have made adjustments as needed.  On reevaluation patient states that his symptoms have somewhat improved.  Disposition: After consideration of the diagnostic results and the patients response to treatment, I feel that emergency department workup does not suggest an emergent condition requiring admission or immediate intervention beyond what has been performed at this time. The plan is: Discharged home with symptomatic treatment including good fluid hydration and a bland diet for enteritis. The patient is safe for discharge and has been instructed to return immediately for worsening symptoms, change in symptoms or any other concerns.         Final Clinical Impression(s) / ED Diagnoses Final diagnoses:  Enteritis  LLQ abdominal pain  Hyperglycemia    Rx / DC Orders ED Discharge Orders     None      Portions of this report may have been transcribed using voice recognition software. Every effort was made to ensure accuracy; however, inadvertent computerized transcription errors may be present.    Jeanella Flattery 07/31/22 2157    Linwood Dibbles, MD 08/01/22 1157

## 2022-08-10 ENCOUNTER — Ambulatory Visit: Payer: Self-pay | Admitting: Family Medicine

## 2022-08-27 ENCOUNTER — Ambulatory Visit: Payer: Self-pay | Admitting: Family Medicine

## 2022-11-06 ENCOUNTER — Encounter: Payer: Self-pay | Admitting: Family Medicine

## 2022-11-06 ENCOUNTER — Ambulatory Visit: Payer: BC Managed Care – PPO | Admitting: Family Medicine

## 2022-11-06 VITALS — BP 120/84 | HR 84 | Ht 73.0 in | Wt 254.6 lb

## 2022-11-06 DIAGNOSIS — E785 Hyperlipidemia, unspecified: Secondary | ICD-10-CM | POA: Diagnosis not present

## 2022-11-06 DIAGNOSIS — E119 Type 2 diabetes mellitus without complications: Secondary | ICD-10-CM

## 2022-11-06 DIAGNOSIS — E1169 Type 2 diabetes mellitus with other specified complication: Secondary | ICD-10-CM | POA: Diagnosis not present

## 2022-11-06 DIAGNOSIS — E1159 Type 2 diabetes mellitus with other circulatory complications: Secondary | ICD-10-CM | POA: Diagnosis not present

## 2022-11-06 DIAGNOSIS — E669 Obesity, unspecified: Secondary | ICD-10-CM | POA: Diagnosis not present

## 2022-11-06 DIAGNOSIS — I152 Hypertension secondary to endocrine disorders: Secondary | ICD-10-CM

## 2022-11-06 DIAGNOSIS — H35033 Hypertensive retinopathy, bilateral: Secondary | ICD-10-CM | POA: Diagnosis not present

## 2022-11-06 LAB — LIPID PANEL
Chol/HDL Ratio: 6.3 ratio — ABNORMAL HIGH (ref 0.0–5.0)
Cholesterol, Total: 202 mg/dL — ABNORMAL HIGH (ref 100–199)
HDL: 32 mg/dL — ABNORMAL LOW (ref >39)
LDL Chol Calc (NIH): 131 mg/dL — ABNORMAL HIGH (ref 0–99)
Triglycerides: 219 mg/dL — ABNORMAL HIGH (ref 0–149)
VLDL Cholesterol Cal: 39 mg/dL (ref 5–40)

## 2022-11-06 LAB — POCT GLYCOSYLATED HEMOGLOBIN (HGB A1C): Hemoglobin A1C: 6.8 % — AB (ref 4.0–5.6)

## 2022-11-06 MED ORDER — METFORMIN HCL 850 MG PO TABS: 850.0000 mg | ORAL_TABLET | Freq: Two times a day (BID) | ORAL | 1 refills | Status: DC

## 2022-11-06 NOTE — Patient Instructions (Signed)
Stop the glimepiride start taking the new metformin I will call in something for your cholesterol and set up an appointment for complete exam

## 2022-11-06 NOTE — Progress Notes (Signed)
Subjective:    Patient ID: Shawn [name], male    DOB: December 13, 1986, 36 y.o.   MRN: [id number]  Shawn [name] is a 36 y.o. male who presents for follow-up of Type 2 diabetes mellitus.  He is a former patient of mine who is now back in [place]. He was diagnosed in 2017 and has been meeting his care mainly through urgent care centers.  He initially did get education concerning his diabetes with classes.  He is also on lisinopril but has not been placed on a statin drug.  His last hemoglobin A1c was 9.2. Patient is checking home blood sugars.   Home blood sugar records: BGs range between 180 and 190 How often is blood sugars being checked: bid Current symptoms/problems include none and have been stable. Daily foot checks: no    Any foot concerns: foot pain R Last eye exam: today Exercise: Gym/ health club routine includes once a week. He is on metformin 500 twice daily is well as glipizide. The following portions of the patient's history were reviewed and updated as appropriate: allergies, current medications, past medical history, past social history and problem list.  ROS as in subjective above.     Objective:    Physical Exam Alert and in no distress otherwise not examined.  He was seen in August and blood work was done.  That was reviewed.  At that point the A1c was 9.2.  He has been doing a better job of taking care of himself.    Lab Review    Latest Ref Rng & Units 11/06/2022    9:07 AM 07/31/2022    8:16 PM 10/24/2020    8:33 PM 01/01/2016    9:42 PM 07/14/2012    3:24 PM  Diabetic Labs  HbA1c 4.0 - 5.6 % 6.8       Chol 0 - 200 mg/dL     194   HDL >39 mg/dL     28   Calc LDL 0 - 99 mg/dL     130   Triglycerides <150 mg/dL     180   Creatinine 0.61 - 1.24 mg/dL  1.04  1.27  0.98        11/06/2022    8:39 AM 07/31/2022   10:06 PM 07/31/2022    8:12 PM 07/31/2022    8:09 PM 02/08/2022   11:42 AM  BP/Weight  Systolic BP 767 341 937    Diastolic BP 84 87 92    Wt. (Lbs)  254.6   255 255.6  BMI 33.59 kg/m2   33.64 kg/m2 33.72 kg/m2       No data to display          Ashby  reports that he has been smoking cigarettes. He has never used smokeless tobacco. He reports that he does not drink alcohol and does not use drugs.     Assessment & Plan:    Diabetes mellitus without complication ([place]) - Plan: HgB A1c, metFORMIN (GLUCOPHAGE) 850 MG tablet  Obesity (BMI 30-39.9)  Hyperlipidemia associated with type 2 diabetes mellitus ([place]) - Plan: Lipid panel  Hypertension associated with diabetes ([place])  Rx changes:  I will increase his metformin.  He is to stop the glimepiride, continue lisinopril and I will check lipids and decide the dosing of his statin. Education: Reviewed 'ABCs' of diabetes management (respective goals in parentheses):  A1C (<7), blood pressure (<130/80), and cholesterol (LDL <100). Compliance at present is estimated to be good. Efforts  to improve compliance (if necessary) will be directed at increased exercise.  Discussed 20 minutes of something physical daily.  Also discussed cutting back on carbohydrates specifically bread, rice, pasta, potatoes and sugar. Follow up: 4 months he is apparently scheduled for follow-up exam in February.

## 2022-11-07 MED ORDER — ROSUVASTATIN CALCIUM 20 MG PO TABS: 20.0000 mg | ORAL_TABLET | Freq: Every day | ORAL | 3 refills | Status: DC

## 2022-11-07 NOTE — Addendum Note (Signed)
Addended by: Ronnald Nian on: 11/07/2022 04:24 AM   Modules accepted: Orders

## 2022-12-18 ENCOUNTER — Telehealth: Payer: BC Managed Care – PPO | Admitting: Family Medicine

## 2022-12-18 ENCOUNTER — Encounter: Payer: Self-pay | Admitting: Family Medicine

## 2022-12-18 DIAGNOSIS — E119 Type 2 diabetes mellitus without complications: Secondary | ICD-10-CM

## 2022-12-18 NOTE — Progress Notes (Signed)
   Subjective:    Patient ID: Shawn [name], male    DOB: 09-Jul-1986, 36 y.o.   MRN: [id number]  HPI Documentation for virtual audio and video telecommunications through Caregility encounter: The patient was located at home. 2 patient identifiers used.  The provider was located in the office. The patient did consent to this visit and is aware of possible charges through their insurance for this visit. The other persons participating in this telemedicine service were none. Time spent on call was 5 minutes and in review of previous records >15 minutes total for counseling and coordination of care. This virtual service is not related to other E/M service within previous 7 days.  He states that over the last week or so he has noted his blood sugars in the 300 range.  He does check them in the morning and then at random times during the day.  He has made some diet and exercise changes and is presently taking metformin and having no difficulty with that.  Review of Systems     Objective:   Physical Exam Alert and in no distress otherwise not examined.       Assessment & Plan:  Diabetes mellitus without complication (HCC) Encouraged him to check his blood sugars fasting or 2 hours after meals for the next week and then we will discuss the results.  Explained that we might possibly need to add another medication to his regimen in spite of him making diet and exercise changes.

## 2022-12-26 ENCOUNTER — Encounter: Payer: Self-pay | Admitting: Family Medicine

## 2022-12-26 MED ORDER — EMPAGLIFLOZIN 25 MG PO TABS: 25.0000 mg | ORAL_TABLET | Freq: Every day | ORAL | 5 refills | Status: DC

## 2022-12-28 ENCOUNTER — Encounter: Payer: Self-pay | Admitting: Family Medicine

## 2022-12-28 ENCOUNTER — Ambulatory Visit: Payer: BC Managed Care – PPO | Admitting: Family Medicine

## 2022-12-28 VITALS — BP 132/84 | HR 67 | Wt 251.6 lb

## 2022-12-28 DIAGNOSIS — E119 Type 2 diabetes mellitus without complications: Secondary | ICD-10-CM

## 2022-12-28 DIAGNOSIS — E669 Obesity, unspecified: Secondary | ICD-10-CM

## 2022-12-28 MED ORDER — STEGLATRO 5 MG PO TABS: 5.0000 mg | ORAL_TABLET | Freq: Every day | ORAL | 0 refills | Status: DC

## 2022-12-28 NOTE — Progress Notes (Signed)
  Subjective:    Patient ID: Shawn [name], male    DOB: 1986/05/22, 35 y.o.   MRN: [id number]  Shawn [name] is a 36 y.o. male who presents for follow-up of Type 2 diabetes mellitus.  Home blood sugar records:  ranging in the 300's Current symptoms/problems include none and have been stable. Daily foot checks: yes   Any foot concerns: no How often blood sugars checked: Exercise:  has made some exercise changes Diet: has made some dietary changes He states that in spite of being on metformin and doing diet and exercise his blood sugars are still running in the 300 range.  He did try Jardiance in the past but caused some genital discomfort and stopped the medication because of that. The following portions of the patient's history were reviewed and updated as appropriate: allergies, current medications, past medical history, past social history and problem list.  ROS as in subjective above.     Objective:    Physical Exam Alert and in no distress otherwise not examined.  Blood pressure 132/84, pulse 67, weight 251 lb 9.6 oz (114.1 kg), SpO2 96 %.  Lab Review    Latest Ref Rng & Units 11/06/2022    9:15 AM 11/06/2022    9:07 AM 07/31/2022    8:16 PM 10/24/2020    8:33 PM 01/01/2016    9:42 PM  Diabetic Labs  HbA1c 4.0 - 5.6 %  6.8      Chol 100 - 199 mg/dL 619       HDL >50 mg/dL 32       Calc LDL 0 - 99 mg/dL 932       Triglycerides 0 - 149 mg/dL 671       Creatinine 2.45 - 1.24 mg/dL   8.09  9.83  3.82       12/28/2022   10:30 AM 11/06/2022    8:39 AM 07/31/2022   10:06 PM 07/31/2022    8:12 PM 07/31/2022    8:09 PM  BP/Weight  Systolic BP 132 120 144 142   Diastolic BP 84 84 87 92   Wt. (Lbs) 251.6 254.6   255  BMI 33.19 kg/m2 33.59 kg/m2   33.64 kg/m2       No data to display          Shawn [name]  reports that he has been smoking cigarettes. He has never used smokeless tobacco. He reports that he does not drink alcohol and does not use drugs.     Assessment &  Plan:    Diabetes mellitus without complication (HCC) - Plan: Amb Referral to Nutrition and Diabetic Education, ertugliflozin L-PyroglutamicAc (STEGLATRO) 5 MG TABS tablet  Obesity (BMI 30-39.9)  I explained that even though this is the same class of drug as the [place] is, it might not have the same effect.  He will keep me informed as to whether he has difficulty with that.  I will also refer him to diabetes and nutrition to see if that will help get him under better control.

## 2023-01-21 ENCOUNTER — Encounter: Payer: BC Managed Care – PPO | Attending: Family Medicine | Admitting: Dietician

## 2023-01-21 ENCOUNTER — Encounter: Payer: Self-pay | Admitting: Family Medicine

## 2023-01-21 ENCOUNTER — Ambulatory Visit (INDEPENDENT_AMBULATORY_CARE_PROVIDER_SITE_OTHER): Payer: BC Managed Care – PPO | Admitting: Family Medicine

## 2023-01-21 ENCOUNTER — Other Ambulatory Visit: Payer: Self-pay

## 2023-01-21 ENCOUNTER — Encounter: Payer: Self-pay | Admitting: Dietician

## 2023-01-21 VITALS — Ht 73.0 in

## 2023-01-21 VITALS — BP 138/88 | HR 92 | Temp 98.0°F | Resp 16 | Ht 73.25 in | Wt 248.0 lb

## 2023-01-21 DIAGNOSIS — E1159 Type 2 diabetes mellitus with other circulatory complications: Secondary | ICD-10-CM

## 2023-01-21 DIAGNOSIS — Z0000 Encounter for general adult medical examination without abnormal findings: Secondary | ICD-10-CM

## 2023-01-21 DIAGNOSIS — E1169 Type 2 diabetes mellitus with other specified complication: Secondary | ICD-10-CM | POA: Diagnosis not present

## 2023-01-21 DIAGNOSIS — Z23 Encounter for immunization: Secondary | ICD-10-CM

## 2023-01-21 DIAGNOSIS — E119 Type 2 diabetes mellitus without complications: Secondary | ICD-10-CM

## 2023-01-21 DIAGNOSIS — E669 Obesity, unspecified: Secondary | ICD-10-CM | POA: Diagnosis not present

## 2023-01-21 DIAGNOSIS — I152 Hypertension secondary to endocrine disorders: Secondary | ICD-10-CM

## 2023-01-21 DIAGNOSIS — E785 Hyperlipidemia, unspecified: Secondary | ICD-10-CM

## 2023-01-21 LAB — POCT URINALYSIS DIP (PROADVANTAGE DEVICE)
Bilirubin, UA: NEGATIVE
Blood, UA: NEGATIVE
Glucose, UA: >=1000 mg/dL — AB
Ketones, POC UA: NEGATIVE mg/dL
Leukocytes, UA: NEGATIVE — AB
Nitrite, UA: NEGATIVE
Protein Ur, POC: NEGATIVE mg/dL
Specific Gravity, Urine: 1.01
Urobilinogen, Ur: 2
pH, UA: 6.5 (ref 5.0–8.0)

## 2023-01-21 LAB — POCT GLYCOSYLATED HEMOGLOBIN (HGB A1C): Hemoglobin A1C: 10.1 % — AB (ref 4.0–5.6)

## 2023-01-21 MED ORDER — INSULIN STARTER KIT- PEN NEEDLES (ENGLISH): 1.0000 | Freq: Once | 0 refills | Status: AC

## 2023-01-21 MED ORDER — INSULIN PEN NEEDLE 32G X 4 MM MISC: 1 refills | Status: AC

## 2023-01-21 MED ORDER — FREESTYLE LIBRE 3 SENSOR MISC: 1.0000 [IU] | 5 refills | Status: DC

## 2023-01-21 MED ORDER — INSULIN STARTER KIT- PEN NEEDLES (ENGLISH): 1.0000 | Freq: Once | 0 refills | Status: DC

## 2023-01-21 MED ORDER — SOLIQUA 100-33 UNT-MCG/ML ~~LOC~~ SOPN: 15.0000 [IU] | PEN_INJECTOR | Freq: Every day | SUBCUTANEOUS | 5 refills | Status: DC

## 2023-01-21 NOTE — Progress Notes (Signed)
Diabetes Self-Management Education  Visit Type: First/Initial  Appt. Start Time: 0904 Appt. End Time: 1005  01/21/2023  Mr. Shawn [name], identified by name and date of birth, is a 37 y.o. male with a diagnosis of Diabetes: Type 2.   ASSESSMENT  History includes: type 2 diabetes Labs noted: 11/06/22 A1c 6.8% Medications include: metformin  Supplements: not assessed  Pt states he was in denial at first when he found out he had diabetes and did not take care of himself. He reports he was on insulin in the past and lost 40lbs after working on his health. Pt has been off insulin for 7 months.  Pt reports he checks his blood glucose 3x/day and his fasting and post-prandial glucose have been 200+.  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   Pt goes to the gym 2x/wk and tries to do push-ups when he gets out of his truck but states he plans on getting a gym membership.   Pt reports he wants to finally take care of his health after a few health scares.  Height 6\' 1"  (1.854 m). Body mass index is 33.19 kg/m.   Diabetes Self-Management Education - 01/21/23 0904       Visit Information   Visit Type First/Initial      Initial Visit   Diabetes Type Type 2    Date Diagnosed 2017    Are you currently following a meal plan? No    Are you taking your medications as prescribed? Yes      Health Coping   How would you rate your overall health? Fair      Psychosocial Assessment   Patient Belief/Attitude about Diabetes Afraid    What is the hardest part about your diabetes right now, causing you the most concern, or is the most worrisome to you about your diabetes?   Making healty food and beverage choices    Self-care barriers None    Self-management support Doctor's office    Other persons present Patient    Patient Concerns Nutrition/Meal planning    Special Needs None    Preferred Learning Style No preference indicated    Learning Readiness Ready    How often do you need  to have someone help you when you read instructions, pamphlets, or other written materials from your doctor or pharmacy? 1 - Never    What is the last grade level you completed in school? college      Pre-Education Assessment   Patient understands the diabetes disease and treatment process. Needs Instruction    Patient understands incorporating nutritional management into lifestyle. Needs Instruction    Patient undertands incorporating physical activity into lifestyle. Needs Instruction    Patient understands using medications safely. Needs Instruction    Patient understands monitoring blood glucose, interpreting and using results Needs Instruction    Patient understands prevention, detection, and treatment of acute complications. Needs Instruction    Patient understands prevention, detection, and treatment of chronic complications. Needs Instruction    Patient understands how to develop strategies to address psychosocial issues. Needs Instruction    Patient understands how to develop strategies to promote health/change behavior. Needs Instruction      Complications   Last HgB A1C per patient/outside source 6.8 %    How often do you check your blood sugar? 3-4 times/day    Fasting Blood glucose range (mg/dL) 2018    Postprandial Blood glucose range (mg/dL) >454    Have you had a dilated eye  exam in the past 12 months? Yes    Have you had a dental exam in the past 12 months? Yes    Are you checking your feet? Yes    How many days per week are you checking your feet? 7      Dietary Intake   Breakfast protein shake and bar    Snack (morning) carrots and pepperoni OR none    Lunch deli sandwich OR none    Snack (afternoon) carrots OR oranges    Dinner salad with grilled chicken and ranch    Snack (evening) none    Beverage(s) 1 gallon water with crystal light, occasional diet soda,      Activity / Exercise   Activity / Exercise Type Light (walking / raking leaves)    How many days per  week do you exercise? 2    How many minutes per day do you exercise? 30    Total minutes per week of exercise 60      Patient Education   Previous Diabetes Education No    Disease Pathophysiology Factors that contribute to the development of diabetes;Definition of diabetes, type 1 and 2, and the diagnosis of diabetes;Explored patient's options for treatment of their diabetes    Healthy Eating Role of diet in the treatment of diabetes and the relationship between the three main macronutrients and blood glucose level;Food label reading, portion sizes and measuring food.;Plate Method;Reviewed blood glucose goals for pre and post meals and how to evaluate the patients' food intake on their blood glucose level.;Meal timing in regards to the patients' current diabetes medication.;Information on hints to eating out and maintain blood glucose control.;Meal options for control of blood glucose level and chronic complications.    Being Active Helped patient identify appropriate exercises in relation to his/her diabetes, diabetes complications and other health issue.;Role of exercise on diabetes management, blood pressure control and cardiac health.    Medications Reviewed patients medication for diabetes, action, purpose, timing of dose and side effects.    Monitoring Identified appropriate SMBG and/or A1C goals.;Daily foot exams;Yearly dilated eye exam    Acute complications Taught prevention, symptoms, and  treatment of hypoglycemia - the 15 rule.;Discussed and identified patients' prevention, symptoms, and treatment of hyperglycemia.    Chronic complications Relationship between chronic complications and blood glucose control;Assessed and discussed foot care and prevention of foot problems;Lipid levels, blood glucose control and heart disease;Identified and discussed with patient  current chronic complications;Dental care;Retinopathy and reason for yearly dilated eye exams;Nephropathy, what it is, prevention  of, the use of ACE, ARB's and early detection of through urine microalbumia.;Reviewed with patient heart disease, higher risk of, and prevention    Diabetes Stress and Support Identified and addressed patients feelings and concerns about diabetes;Worked with patient to identify barriers to care and solutions;Role of stress on diabetes    Lifestyle and Health Coping Lifestyle issues that need to be addressed for better diabetes care      Individualized Goals (developed by patient)   Nutrition General guidelines for healthy choices and portions discussed    Physical Activity Exercise 5-7 days per week;30 minutes per day    Medications take my medication as prescribed    Monitoring  Test my blood glucose as discussed    Problem Solving Eating Pattern    Reducing Risk examine blood glucose patterns;do foot checks daily;treat hypoglycemia with 15 grams of carbs if blood glucose less than 70mg /dL    Health Coping Ask for help with psychological, social,  or emotional issues      Post-Education Assessment   Patient understands the diabetes disease and treatment process. Comprehends key points    Patient understands incorporating nutritional management into lifestyle. Comprehends key points    Patient undertands incorporating physical activity into lifestyle. Comprehends key points    Patient understands using medications safely. Comphrehends key points    Patient understands monitoring blood glucose, interpreting and using results Comprehends key points    Patient understands prevention, detection, and treatment of acute complications. Comprehends key points    Patient understands prevention, detection, and treatment of chronic complications. Comprehends key points    Patient understands how to develop strategies to address psychosocial issues. Comprehends key points    Patient understands how to develop strategies to promote health/change behavior. Comprehends key points      Outcomes   Expected  Outcomes Demonstrated interest in learning. Expect positive outcomes    Future DMSE 3-4 months    Program Status Not Completed             Individualized Plan for Diabetes Self-Management Training:   Learning Objective:  Patient will have a greater understanding of diabetes self-management. Patient education plan is to attend individual and/or group sessions per assessed needs and concerns.   Plan:   Patient Instructions  Aim for 150 minutes of physical activity weekly. Goal: Exercise for 30 minutes 5x/wk.   Eat more Non-Starchy Vegetables. Goal: aim to make 1/2 of your plate vegetables at least 2x/day.   Make simple meals at home more often than eating out.  When snacking always include a protein with your carbohydrate!  Dressing recommendation: [place] [employer]   Expected Outcomes:  Demonstrated interest in learning. Expect positive outcomes  Education material provided: ADA - How to Thrive: A Guide for Your Journey with Diabetes and Snack sheet  If problems or questions, patient to contact team via:  Phone  Future DSME appointment: 3-4 months

## 2023-01-21 NOTE — Patient Instructions (Addendum)
Aim for 150 minutes of physical activity weekly. Goal: Exercise for 30 minutes 5x/wk.   Eat more Non-Starchy Vegetables. Goal: aim to make 1/2 of your plate vegetables at least 2x/day.   Make simple meals at home more often than eating out.  When snacking always include a protein with your carbohydrate!  Dressing recommendation: [employer]

## 2023-01-21 NOTE — Progress Notes (Signed)
Complete physical exam  Patient: Shawn [name]   DOB: 13-Sep-1986   37 y.o. Male  MRN: [id number]  Subjective:    Chief Complaint  Patient presents with   Annual Exam    Annual physical exam. Fasting.     Shawn [name] is a 37 y.o. male who presents today for a complete physical exam. He reports consuming a general diet. Gym/ health club routine includes weight lifting and cardio. He generally feels fairly well. He reports sleeping fairly well. He does have additional problems to discuss today.  He was diagnosed with diabetes in 2017 however since then he has had episodic care getting various medications given to him but did not have adequate insurance.  He has been tried on Jardiance and most recently was given [place] both which had unacceptable urinary side effects.  He also was tried on Januvia for several months with little effect.  He was given insulin and in fact in the past had been on freestyle libre 2 which she enjoyed and unfortunately insurance ran out.  He was started on Crestor as well as Glucophage on his last admission.  Otherwise his family and social history as well as health maintenance immunizations was reviewed.   Most recent fall risk assessment:    01/21/2023    1:51 PM  [place] in the past year? 0  Number falls in past yr: 0  Injury with Fall? 0  Risk for fall due to : No Fall Risks  Follow up Falls evaluation completed     Most recent depression screenings:    01/21/2023    1:51 PM 01/21/2023    9:10 AM  PHQ 2/9 Scores  PHQ - 2 Score 0 0    Vision:Within last year and Dental: Receives regular dental care    Patient Care Team: Denita Lung, MD as PCP - General (Family Medicine)   Outpatient Medications Prior to Visit  Medication Sig   metFORMIN (GLUCOPHAGE) 850 MG tablet Take 1 tablet (850 mg total) by mouth 2 (two) times daily with a meal.   rosuvastatin (CRESTOR) 20 MG tablet Take 1 tablet (20 mg total) by mouth daily.    ertugliflozin L-PyroglutamicAc (STEGLATRO) 5 MG TABS tablet Take 1 tablet (5 mg total) by mouth daily. (Patient not taking: Reported on 01/21/2023)   lisinopril (ZESTRIL) 5 MG tablet Take 1 tablet (5 mg total) by mouth at bedtime. (Patient not taking: Reported on 01/21/2023)   No facility-administered medications prior to visit.    Review of Systems  All other systems reviewed and are negative.         Objective:     BP 138/88   Pulse 92   Temp 98 F (36.7 C) (Oral)   Resp 16   Ht 6' 1.25" (1.861 m)   Wt 248 lb (112.5 kg)   SpO2 97% Comment: room air  BMI 32.50 kg/m    Physical Exam  Alert and in no distress. Tympanic membranes and canals are normal. Pharyngeal area is normal. Neck is supple without adenopathy or thyromegaly. Cardiac exam shows a regular sinus rhythm without murmurs or gallops. Lungs are clear to auscultation. Hemoglobin A1c is 10.0 Results for orders placed or performed in visit on 01/21/23  POCT glycosylated hemoglobin (Hb A1C)  Result Value Ref Range   Hemoglobin A1C 10.1 (A) 4.0 - 5.6 %  POCT Urinalysis DIP (Proadvantage Device)  Result Value Ref Range   Color, UA yellow yellow  Clarity, UA clear clear   Glucose, UA >=1,000 (A) negative mg/dL   Bilirubin, UA negative negative   Ketones, POC UA negative negative mg/dL   Specific Gravity, Urine 1.010    Blood, UA negative negative   pH, UA 6.5 5.0 - 8.0   Protein Ur, POC negative negative mg/dL   Urobilinogen, Ur 2.0    Nitrite, UA Negative Negative   Leukocytes, UA Negative (A) Negative       Assessment & Plan:    Routine general medical examination at a health care facility - Plan: Lipid panel, Comprehensive metabolic panel, POCT Urinalysis DIP (Proadvantage Device)  Diabetes mellitus without complication (HCC) - Plan: POCT glycosylated hemoglobin (Hb A1C), Comprehensive metabolic panel, Insulin Glargine-Lixisenatide (SOLIQUA) 100-33 UNT-MCG/ML SOPN, Continuous Blood Gluc Sensor (FREESTYLE  LIBRE 3 SENSOR) MISC, insulin starter kit- pen needles MISC, DISCONTINUED: insulin starter kit- pen needles MISC  Hypertension associated with diabetes (HCC)  Hyperlipidemia associated with type 2 diabetes mellitus (HCC) - Plan: Lipid panel  Obesity (BMI 30-39.9)  Immunization History  Administered Date(s) Administered   DTaP 07/10/1985, 09/21/1985, 11/20/1985, 12/06/1986, 07/21/1990   IPV 07/10/1985, 09/21/1985, 11/20/1985, 12/06/1986   MMR 10/30/1986, 09/16/2003   PPD Test 10/02/1986, 07/21/1990   Td 09/16/2003   Tdap 11/21/2015, 04/30/2021    Health Maintenance  Topic Date Due   FOOT EXAM  Never done   HIV Screening  Never done   Diabetic kidney evaluation - Urine ACR  Never done   Hepatitis C Screening  Never done   COVID-19 Vaccine (1) 02/06/2023 (Originally [birth year]07[birth year]1985)   INFLUENZA VACCINE  03/31/2023 (Originally 07/31/2022)   HEMOGLOBIN A1C  07/22/2023   Diabetic kidney evaluation - eGFR measurement  08/01/2023   OPHTHALMOLOGY EXAM  11/07/2023   DTaP/Tdap/Td (9 - Td or Tdap) 05/01/2031   HPV VACCINES  Aged Out    Discussed the history of his diabetes with him and at this point I think it is very appropriate to start him on Soliqua.  He does have a good handle on how to use the CGM.  Monitor was sent off for.  He will come back tomorrow with his children and I will look at how well he has done setting up the monitor on his cell phone.  I will then see him back in approximately 1 month after that to see what his numbers are and also adjust his insulin.  Will start him out on the 15 units/day and see how he tolerates that. Problem List Items Addressed This Visit     Diabetes mellitus without complication (HCC)   Relevant Medications   Insulin Glargine-Lixisenatide (SOLIQUA) 100-33 UNT-MCG/ML SOPN   Continuous Blood Gluc Sensor (FREESTYLE LIBRE 3 SENSOR) MISC   insulin starter kit- pen needles MISC   Other Relevant Orders   POCT glycosylated hemoglobin (Hb A1C) (Completed)    Comprehensive metabolic panel   Hyperlipidemia associated with type 2 diabetes mellitus (HCC)   Relevant Medications   Insulin Glargine-Lixisenatide (SOLIQUA) 100-33 UNT-MCG/ML SOPN   Other Relevant Orders   Lipid panel   Hypertension associated with diabetes (HCC)   Relevant Medications   Insulin Glargine-Lixisenatide (SOLIQUA) 100-33 UNT-MCG/ML SOPN   Obesity (BMI 30-39.9)   Relevant Medications   Insulin Glargine-Lixisenatide (SOLIQUA) 100-33 UNT-MCG/ML SOPN   Other Visit Diagnoses     Routine general medical examination at a health care facility    -  Primary   Relevant Orders   Lipid panel   Comprehensive metabolic panel   POCT Urinalysis DIP (Proadvantage Device) (  Completed)        Jill Alexanders, MD

## 2023-01-24 NOTE — Telephone Encounter (Signed)
I called and spoke with patient. He says he was able to apply the freestyle [place] and connect it with the app on his phone.

## 2023-02-15 ENCOUNTER — Telehealth: Payer: BC Managed Care – PPO | Admitting: Physician Assistant

## 2023-02-15 DIAGNOSIS — B9689 Other specified bacterial agents as the cause of diseases classified elsewhere: Secondary | ICD-10-CM

## 2023-02-15 DIAGNOSIS — J208 Acute bronchitis due to other specified organisms: Secondary | ICD-10-CM | POA: Diagnosis not present

## 2023-02-15 MED ORDER — AZITHROMYCIN 250 MG PO TABS: ORAL_TABLET | ORAL | 0 refills | Status: AC

## 2023-02-15 MED ORDER — BENZONATATE 100 MG PO CAPS: 100.0000 mg | ORAL_CAPSULE | Freq: Three times a day (TID) | ORAL | 0 refills | Status: DC | PRN

## 2023-02-15 NOTE — Progress Notes (Signed)
We are sorry that you are not feeling well.  Here is how we plan to help!  Based on your presentation I believe you most likely have A cough due to bacteria.  When patients have  a productive cough with a change in color or increased sputum production, we are concerned about bacterial bronchitis.  If left untreated it can progress to pneumonia.  If your symptoms do not improve with your treatment plan it is important that you contact your provider.   I have prescribed Azithromyin 250 mg: two tablets now and then one tablet daily for 4 additonal days    In addition you may use A prescription cough medication called Tessalon Perles 142m. You may take 1-2 capsules every 8 hours as needed for your cough.  From your responses in the eVisit questionnaire you describe inflammation in the upper respiratory tract which is causing a significant cough.  This is commonly called Bronchitis and has four common causes:   Allergies Viral Infections Acid Reflux Bacterial Infection Allergies, viruses and acid reflux are treated by controlling symptoms or eliminating the cause. An example might be a cough caused by taking certain blood pressure medications. You stop the cough by changing the medication. Another example might be a cough caused by acid reflux. Controlling the reflux helps control the cough.  USE OF BRONCHODILATOR ("RESCUE") INHALERS: There is a risk from using your bronchodilator too frequently.  The risk is that over-reliance on a medication which only relaxes the muscles surrounding the breathing tubes can reduce the effectiveness of medications prescribed to reduce swelling and congestion of the tubes themselves.  Although you feel brief relief from the bronchodilator inhaler, your asthma may actually be worsening with the tubes becoming more swollen and filled with mucus.  This can delay other crucial treatments, such as oral steroid medications. If you need to use a bronchodilator inhaler daily,  several times per day, you should discuss this with your provider.  There are probably better treatments that could be used to keep your asthma under control.     HOME CARE Only take medications as instructed by your medical team. Complete the entire course of an antibiotic. Drink plenty of fluids and get plenty of rest. Avoid close contacts especially the very young and the elderly Cover your mouth if you cough or cough into your sleeve. Always remember to wash your hands A steam or ultrasonic humidifier can help congestion.   GET HELP RIGHT AWAY IF: You develop worsening fever. You become short of breath You cough up blood. Your symptoms persist after you have completed your treatment plan MAKE SURE YOU  Understand these instructions. Will watch your condition. Will get help right away if you are not doing well or get worse.    Thank you for choosing an e-visit.  Your e-visit answers were reviewed by a board certified advanced clinical practitioner to complete your personal care plan. Depending upon the condition, your plan could have included both over the counter or prescription medications.  Please review your pharmacy choice. Make sure the pharmacy is open so you can pick up prescription now. If there is a problem, you may contact your provider through [employer] have the prescription routed to another pharmacy.  Your safety is important to [place] If you have drug allergies check your prescription carefully.   For the next 24 hours you can use MyChart to ask questions about today's visit, request a non-urgent call back, or ask for a work  or school excuse. You will get an email in the next two days asking about your experience. I hope that your e-visit has been valuable and will speed your recovery.

## 2023-02-15 NOTE — Progress Notes (Signed)
I have spent 5 minutes in review of e-visit questionnaire, review and updating patient chart, medical decision making and response to patient.   Tamieka Rancourt Cody Zella Dewan, PA-C    

## 2023-02-25 ENCOUNTER — Telehealth: Payer: BC Managed Care – PPO | Admitting: Family Medicine

## 2023-02-25 DIAGNOSIS — E119 Type 2 diabetes mellitus without complications: Secondary | ICD-10-CM | POA: Diagnosis not present

## 2023-02-25 DIAGNOSIS — L738 Other specified follicular disorders: Secondary | ICD-10-CM | POA: Diagnosis not present

## 2023-02-25 MED ORDER — SOLIQUA 100-33 UNT-MCG/ML ~~LOC~~ SOPN
15.0000 [IU] | PEN_INJECTOR | Freq: Every day | SUBCUTANEOUS | 1 refills | Status: AC
Start: 2023-02-25 — End: ?

## 2023-02-25 NOTE — Progress Notes (Signed)
   Subjective:    Patient ID: Shawn [name], male    DOB: 03-29-1986, 37 y.o.   MRN: [id number]  HPI Documentation for virtual audio and video telecommunications through [place] encounter: The patient was located at home. 2 patient identifiers used.  The provider was located in the office. The patient did consent to this visit and is aware of possible charges through their insurance for this visit. The other persons participating in this telemedicine service were none. Time spent on call was 5 minutes and in review of previous records >20 minutes total for counseling and coordination of care. This virtual service is not related to other E/M service within previous 7 days.  He was given a [place] 2 rather than libre 3 and therefore we could not get a good computer driven reading.  He states his sugars are ranging between 18 to 250.  He has made some dietary adjustments and presently is on 15 units of Soliqua.  He states he has cut down on what he eats for breakfast which is usually the one with the sugar going too high.  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  Review of Systems     Objective:   Physical Exam Alert and in no distress otherwise not examined.       Assessment & Plan:  Diabetes mellitus without complication ([place]) - Plan: Insulin Glargine-Lixisenatide (SOLIQUA) 100-33 UNT-MCG/ML SOPN  Folliculitis barbae He is to increase his Soliqua by 2 units every 2 days until his blood sugar is down to 120.  Discussed keeping it from going to 70 or lower to keep him from becoming hypoglycemic.  He will keep an eye on this. I will also write him a note that he has folliculitis barbae and needs to keep his beard trimmed to 1/2 to 1 inch to keep from causing the folliculitis.

## 2023-02-28 ENCOUNTER — Ambulatory Visit: Payer: BC Managed Care – PPO | Admitting: Family Medicine

## 2023-04-12 ENCOUNTER — Telehealth: Payer: BC Managed Care – PPO | Admitting: Emergency Medicine

## 2023-04-12 DIAGNOSIS — J069 Acute upper respiratory infection, unspecified: Secondary | ICD-10-CM

## 2023-04-12 MED ORDER — BENZONATATE 100 MG PO CAPS: 100.0000 mg | ORAL_CAPSULE | Freq: Two times a day (BID) | ORAL | 0 refills | Status: DC | PRN

## 2023-04-12 MED ORDER — FLUTICASONE PROPIONATE 50 MCG/ACT NA SUSP: 2.0000 | Freq: Every day | NASAL | 0 refills | Status: DC

## 2023-04-12 NOTE — Progress Notes (Signed)

## 2023-04-23 ENCOUNTER — Encounter: Payer: Self-pay | Admitting: Family Medicine

## 2023-05-13 ENCOUNTER — Ambulatory Visit: Payer: BC Managed Care – PPO | Admitting: Family Medicine

## 2023-06-17 ENCOUNTER — Other Ambulatory Visit: Payer: Self-pay | Admitting: Family Medicine

## 2023-06-17 DIAGNOSIS — E119 Type 2 diabetes mellitus without complications: Secondary | ICD-10-CM

## 2023-07-17 ENCOUNTER — Other Ambulatory Visit: Payer: Self-pay | Admitting: Family Medicine

## 2023-07-17 DIAGNOSIS — E119 Type 2 diabetes mellitus without complications: Secondary | ICD-10-CM

## 2023-07-17 NOTE — Telephone Encounter (Signed)
Patient overdue for diabetes recheck. I called patient and scheduled appointment for 07/30/2023 at 11:45am.

## 2023-07-30 ENCOUNTER — Encounter: Payer: Self-pay | Admitting: Family Medicine

## 2023-07-30 ENCOUNTER — Ambulatory Visit (INDEPENDENT_AMBULATORY_CARE_PROVIDER_SITE_OTHER): Payer: BC Managed Care – PPO | Admitting: Family Medicine

## 2023-07-30 VITALS — BP 148/94 | HR 72 | Temp 98.2°F | Resp 16 | Wt 257.2 lb

## 2023-07-30 DIAGNOSIS — E119 Type 2 diabetes mellitus without complications: Secondary | ICD-10-CM

## 2023-07-30 DIAGNOSIS — E669 Obesity, unspecified: Secondary | ICD-10-CM

## 2023-07-30 DIAGNOSIS — E1169 Type 2 diabetes mellitus with other specified complication: Secondary | ICD-10-CM | POA: Diagnosis not present

## 2023-07-30 DIAGNOSIS — I152 Hypertension secondary to endocrine disorders: Secondary | ICD-10-CM

## 2023-07-30 DIAGNOSIS — E1159 Type 2 diabetes mellitus with other circulatory complications: Secondary | ICD-10-CM | POA: Diagnosis not present

## 2023-07-30 DIAGNOSIS — R208 Other disturbances of skin sensation: Secondary | ICD-10-CM

## 2023-07-30 DIAGNOSIS — K130 Diseases of lips: Secondary | ICD-10-CM

## 2023-07-30 DIAGNOSIS — E785 Hyperlipidemia, unspecified: Secondary | ICD-10-CM

## 2023-07-30 NOTE — Progress Notes (Signed)
Subjective:    Patient ID: Shawn [name], male    DOB: Jun 27, 1986, 37 y.o.   MRN: [id number]  Shawn [name] [name] is a 37 y.o. male who presents for follow-up of Type 2 diabetes mellitus.  Home blood sugar records:  170-250 Current symptoms/problems include  hand tingling at night  and have been stable. Daily foot checks: yes   Any foot concerns: no How often blood sugars checked: has CGM Exercise: Gym/ health club routine includes light weights and walking. Diet:  well balanced He is using freestyle libre and needs sensors.  He has made some dietary changes and has increased his physical activity.  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  We do not have access to his freestyle libre today.  He is taking Soliqua 16 units as well as metformin 850 twice daily.  He also describes tingling sensation and numbness in his arms mainly distally but he is unsure as to whether it is one-side of the hand or the other.  He did not relate this to wrist or elbow position.  He also has a lesion present on his lip that is slightly pigmented that he has concerns over.  The following portions of the patient's history were reviewed and updated as appropriate: allergies, current medications, past medical history, past social history and problem list.  ROS as in subjective above.     Objective:    Physical Exam Alert and in no distress a 0.5 cm pigmented lesion is noted on the mid lower lip.  Blood pressure (!) 148/94, pulse 72, temperature 98.2 F (36.8 C), temperature source Oral, resp. rate 16, weight 257 lb 3.2 oz (116.7 kg), SpO2 98%.  Lab Review    Latest Ref Rng & Units 01/21/2023    2:09 PM 11/06/2022    9:15 AM 11/06/2022    9:07 AM 07/31/2022    8:16 PM 10/24/2020    8:33 PM  Diabetic Labs  HbA1c 4.0 - 5.6 % 10.1   6.8     Chol 100 - 199 mg/dL  563      HDL >87 mg/dL  32      Calc LDL 0 - 99 mg/dL  564       Triglycerides 0 - 149 mg/dL  332      Creatinine 9.51 - 1.24 mg/dL    8.84  1.66       0/63/0160    2:14 PM 07/30/2023    2:05 PM 01/21/2023    1:47 PM 12/28/2022   10:30 AM 11/06/2022    8:39 AM  BP/Weight  Systolic BP 148 140 138 132 120  Diastolic BP 94 92 88 84 84  Wt. (Lbs)  257.2 248 251.6 254.6  BMI  33.7 kg/m2 32.5 kg/m2 33.19 kg/m2 33.59 kg/m2       No data to display          Yaro  reports that he has been smoking cigarettes. He has never used smokeless tobacco. He reports that he does not drink alcohol and does not use drugs.     Assessment & Plan:    Diabetes mellitus without complication (HCC) - Plan: POCT glycosylated hemoglobin (Hb A1C)  Hyperlipidemia associated with type 2 diabetes mellitus (HCC)  Hypertension associated with diabetes (HCC)  Obesity (BMI 30-39.9)  Lip lesion - Plan: Ambulatory referral to Dermatology He will place the freestyle [place]  on and get hooked into our system so we can look at his blood sugar numbers.  Reinforced the fact that he needs to make continued changes in his diet in regard to more frequent but smaller meals.  Also discussed that to down the road we might be able to possibly stop or at least cut back on his diabetes related medicines trying to get rid of insulin first. Recommend that he pay attention to the symptoms he is having in his arms as to the position of his elbow and wrist and we can reevaluate it based on that.

## 2023-08-28 ENCOUNTER — Encounter: Payer: Self-pay | Admitting: Family Medicine

## 2023-08-30 DIAGNOSIS — L816 Other disorders of diminished melanin formation: Secondary | ICD-10-CM | POA: Diagnosis not present

## 2023-08-30 DIAGNOSIS — L730 Acne keloid: Secondary | ICD-10-CM | POA: Diagnosis not present

## 2023-08-30 DIAGNOSIS — K130 Diseases of lips: Secondary | ICD-10-CM | POA: Diagnosis not present

## 2023-08-30 DIAGNOSIS — D2372 Other benign neoplasm of skin of left lower limb, including hip: Secondary | ICD-10-CM | POA: Diagnosis not present

## 2023-09-03 ENCOUNTER — Encounter: Payer: Self-pay | Admitting: Pharmacist

## 2023-09-04 ENCOUNTER — Ambulatory Visit: Payer: BC Managed Care – PPO | Admitting: Family Medicine

## 2023-09-06 DIAGNOSIS — I10 Essential (primary) hypertension: Secondary | ICD-10-CM | POA: Diagnosis not present

## 2023-10-15 DIAGNOSIS — Z136 Encounter for screening for cardiovascular disorders: Secondary | ICD-10-CM | POA: Diagnosis not present

## 2023-10-15 DIAGNOSIS — E119 Type 2 diabetes mellitus without complications: Secondary | ICD-10-CM | POA: Diagnosis not present

## 2023-10-15 DIAGNOSIS — E1159 Type 2 diabetes mellitus with other circulatory complications: Secondary | ICD-10-CM | POA: Diagnosis not present

## 2023-10-15 DIAGNOSIS — Z13220 Encounter for screening for lipoid disorders: Secondary | ICD-10-CM | POA: Diagnosis not present

## 2023-10-15 DIAGNOSIS — I152 Hypertension secondary to endocrine disorders: Secondary | ICD-10-CM | POA: Diagnosis not present

## 2023-10-15 DIAGNOSIS — Z1330 Encounter for screening examination for mental health and behavioral disorders, unspecified: Secondary | ICD-10-CM | POA: Diagnosis not present

## 2023-11-08 DIAGNOSIS — B9689 Other specified bacterial agents as the cause of diseases classified elsewhere: Secondary | ICD-10-CM | POA: Diagnosis not present

## 2023-11-08 DIAGNOSIS — J208 Acute bronchitis due to other specified organisms: Secondary | ICD-10-CM | POA: Diagnosis not present

## 2023-11-12 DIAGNOSIS — E119 Type 2 diabetes mellitus without complications: Secondary | ICD-10-CM | POA: Diagnosis not present

## 2023-11-12 DIAGNOSIS — E1169 Type 2 diabetes mellitus with other specified complication: Secondary | ICD-10-CM | POA: Diagnosis not present

## 2023-11-12 DIAGNOSIS — E1165 Type 2 diabetes mellitus with hyperglycemia: Secondary | ICD-10-CM | POA: Diagnosis not present

## 2023-11-12 DIAGNOSIS — J019 Acute sinusitis, unspecified: Secondary | ICD-10-CM | POA: Diagnosis not present

## 2023-11-12 DIAGNOSIS — E785 Hyperlipidemia, unspecified: Secondary | ICD-10-CM | POA: Diagnosis not present

## 2023-11-12 DIAGNOSIS — J40 Bronchitis, not specified as acute or chronic: Secondary | ICD-10-CM | POA: Diagnosis not present

## 2023-11-19 DIAGNOSIS — I152 Hypertension secondary to endocrine disorders: Secondary | ICD-10-CM | POA: Diagnosis not present

## 2023-11-19 DIAGNOSIS — Z794 Long term (current) use of insulin: Secondary | ICD-10-CM | POA: Diagnosis not present

## 2023-11-19 DIAGNOSIS — E1169 Type 2 diabetes mellitus with other specified complication: Secondary | ICD-10-CM | POA: Diagnosis not present

## 2023-11-19 DIAGNOSIS — E1165 Type 2 diabetes mellitus with hyperglycemia: Secondary | ICD-10-CM | POA: Diagnosis not present

## 2023-11-19 DIAGNOSIS — J40 Bronchitis, not specified as acute or chronic: Secondary | ICD-10-CM | POA: Diagnosis not present

## 2023-11-19 DIAGNOSIS — R053 Chronic cough: Secondary | ICD-10-CM | POA: Diagnosis not present

## 2023-11-19 DIAGNOSIS — E1159 Type 2 diabetes mellitus with other circulatory complications: Secondary | ICD-10-CM | POA: Diagnosis not present

## 2023-11-19 DIAGNOSIS — R059 Cough, unspecified: Secondary | ICD-10-CM | POA: Diagnosis not present

## 2024-02-25 ENCOUNTER — Encounter: Payer: Self-pay | Admitting: Internal Medicine

## 2024-03-05 DIAGNOSIS — F17290 Nicotine dependence, other tobacco product, uncomplicated: Secondary | ICD-10-CM | POA: Diagnosis not present

## 2024-03-05 DIAGNOSIS — E785 Hyperlipidemia, unspecified: Secondary | ICD-10-CM | POA: Diagnosis not present

## 2024-03-05 DIAGNOSIS — J069 Acute upper respiratory infection, unspecified: Secondary | ICD-10-CM | POA: Diagnosis not present

## 2024-03-05 DIAGNOSIS — E1169 Type 2 diabetes mellitus with other specified complication: Secondary | ICD-10-CM | POA: Diagnosis not present

## 2024-03-05 DIAGNOSIS — E1159 Type 2 diabetes mellitus with other circulatory complications: Secondary | ICD-10-CM | POA: Diagnosis not present

## 2024-03-30 DIAGNOSIS — E119 Type 2 diabetes mellitus without complications: Secondary | ICD-10-CM | POA: Diagnosis not present

## 2024-03-30 DIAGNOSIS — E1169 Type 2 diabetes mellitus with other specified complication: Secondary | ICD-10-CM | POA: Diagnosis not present

## 2024-03-30 DIAGNOSIS — R5383 Other fatigue: Secondary | ICD-10-CM | POA: Diagnosis not present

## 2024-03-30 DIAGNOSIS — E1159 Type 2 diabetes mellitus with other circulatory complications: Secondary | ICD-10-CM | POA: Diagnosis not present

## 2024-03-30 DIAGNOSIS — Z0000 Encounter for general adult medical examination without abnormal findings: Secondary | ICD-10-CM | POA: Diagnosis not present

## 2024-03-30 DIAGNOSIS — E785 Hyperlipidemia, unspecified: Secondary | ICD-10-CM | POA: Diagnosis not present

## 2024-03-30 DIAGNOSIS — E1165 Type 2 diabetes mellitus with hyperglycemia: Secondary | ICD-10-CM | POA: Diagnosis not present

## 2024-03-30 DIAGNOSIS — Z125 Encounter for screening for malignant neoplasm of prostate: Secondary | ICD-10-CM | POA: Diagnosis not present

## 2024-03-30 DIAGNOSIS — I152 Hypertension secondary to endocrine disorders: Secondary | ICD-10-CM | POA: Diagnosis not present

## 2024-07-07 DIAGNOSIS — E1169 Type 2 diabetes mellitus with other specified complication: Secondary | ICD-10-CM | POA: Diagnosis not present

## 2024-07-07 DIAGNOSIS — E1159 Type 2 diabetes mellitus with other circulatory complications: Secondary | ICD-10-CM | POA: Diagnosis not present

## 2024-07-07 DIAGNOSIS — I152 Hypertension secondary to endocrine disorders: Secondary | ICD-10-CM | POA: Diagnosis not present

## 2024-07-07 DIAGNOSIS — K130 Diseases of lips: Secondary | ICD-10-CM | POA: Diagnosis not present

## 2024-07-07 DIAGNOSIS — E119 Type 2 diabetes mellitus without complications: Secondary | ICD-10-CM | POA: Diagnosis not present

## 2024-07-07 DIAGNOSIS — E785 Hyperlipidemia, unspecified: Secondary | ICD-10-CM | POA: Diagnosis not present

## 2024-07-07 DIAGNOSIS — E1165 Type 2 diabetes mellitus with hyperglycemia: Secondary | ICD-10-CM | POA: Diagnosis not present

## 2024-09-03 DIAGNOSIS — L988 Other specified disorders of the skin and subcutaneous tissue: Secondary | ICD-10-CM | POA: Diagnosis not present

## 2024-09-17 ENCOUNTER — Ambulatory Visit: Admitting: Family Medicine

## 2024-09-24 ENCOUNTER — Ambulatory Visit: Admitting: Family Medicine

## 2024-10-06 ENCOUNTER — Ambulatory Visit: Admitting: Family Medicine

## 2024-10-13 ENCOUNTER — Ambulatory Visit: Admitting: Family Medicine

## 2024-10-20 ENCOUNTER — Ambulatory Visit: Admitting: Family Medicine

## 2024-10-21 ENCOUNTER — Ambulatory Visit: Admitting: Family Medicine

## 2024-10-27 ENCOUNTER — Encounter: Payer: Self-pay | Admitting: Family Medicine

## 2024-10-27 ENCOUNTER — Telehealth: Admitting: Family Medicine

## 2024-10-27 VITALS — Ht 73.0 in | Wt 245.0 lb

## 2024-10-27 DIAGNOSIS — Z87891 Personal history of nicotine dependence: Secondary | ICD-10-CM

## 2024-10-27 DIAGNOSIS — Z6379 Other stressful life events affecting family and household: Secondary | ICD-10-CM

## 2024-10-27 DIAGNOSIS — E119 Type 2 diabetes mellitus without complications: Secondary | ICD-10-CM | POA: Diagnosis not present

## 2024-10-27 NOTE — Progress Notes (Signed)
   Subjective:    Patient ID: Shawn [name], male    DOB: [birth year], 38 y.o.   MRN: [id number]  HPI Documentation for virtual audio and video telecommunications through Caregility encounter:  The patient was located at home. 2 patient identifiers used.  The provider was located in the office. The patient did consent to this visit and is aware of possible charges through their insurance for this visit.  The other persons participating in this telemedicine service were none. Time spent on call was 5 minutes and in review of previous records >25 minutes total for counseling and coordination of care.  This virtual service is not related to other E/M service within previous 7 days.  He is here for consult concerning multiple issues.  He took a job in [place] and moved to family there.  Recently his wife was diagnosed with breast cancer and they are in the early stages of getting this all squared away.  He did talk to the staff at the oncology office and realized that he does have support services available to him.  He has 2 sons, 5 and 7 that have had to change schools and this is creating some difficulties.  Recently he was offered a potential job in [place]  however I explained that that is probably not a wise move at the present time.  He does have underlying diabetes and did see a physician with [facility].  That record on March 31 was reviewed.  His A1c was 6.5.  He would like to switch back to me.  He feels much more comfortable with me as we have a long relationship.  Also of note is the fact that he recently quit smoking   Review of Systems     Objective:    Physical Exam Alert and in no distress otherwise not examined       Assessment & Plan:  Stress due to illness of family member  Former smoker  Diabetes mellitus without complication (HCC)  At this point I recommended to continue on his present medication regimen.  I will be happy to take over his care.  I did explain that  next June I will be retiring.  He plans to stick with me and at that point we will make a decision as to where he goes.  Encouraged him to use support services through the oncologist as well as at school for the children.  Continue on his present medication regimen and I will follow-up with him concerning his overall care with his next appointment.  His last complete exam was March 31 of this year.  Over 25 years spent in counseling and coordination of care.

## 2024-10-28 ENCOUNTER — Telehealth: Payer: Self-pay | Admitting: Family Medicine

## 2024-10-28 NOTE — Telephone Encounter (Signed)
 Called pt lmtrc as Dr Joyce want him scheduled for cpe.

## 2024-11-17 ENCOUNTER — Encounter: Payer: Self-pay | Admitting: Family Medicine

## 2024-11-17 ENCOUNTER — Ambulatory Visit: Admitting: Family Medicine

## 2024-11-17 VITALS — BP 130/100 | HR 84 | Ht 73.0 in | Wt 256.8 lb

## 2024-11-17 DIAGNOSIS — N509 Disorder of male genital organs, unspecified: Secondary | ICD-10-CM

## 2024-11-17 DIAGNOSIS — I700 Atherosclerosis of aorta: Secondary | ICD-10-CM

## 2024-11-17 DIAGNOSIS — E119 Type 2 diabetes mellitus without complications: Secondary | ICD-10-CM | POA: Diagnosis not present

## 2024-11-17 LAB — POCT GLYCOSYLATED HEMOGLOBIN (HGB A1C): Hemoglobin A1C: 7.9 % — AB (ref 4.0–5.6)

## 2024-11-17 MED ORDER — DEXCOM G7 SENSOR MISC
5 refills | Status: AC
Start: 2024-11-17 — End: ?

## 2024-11-17 MED ORDER — DEXCOM G7 RECEIVER DEVI
1 refills | Status: AC
Start: 2024-11-17 — End: ?

## 2024-11-17 NOTE — Progress Notes (Signed)
   Subjective:    Patient ID: Shawn [name], male    DOB: 1986/02/08, 38 y.o.   MRN: [id number]  Discussed the use of AI scribe software for clinical note transcription with the patient, who gave verbal consent to proceed.  History of Present Illness   Shawn [name] is a 38 year old male who presents for a comprehensive health check-up and diabetes management.   He had a physical in April 14, 2024 and is aware that another cannot be coded until next 04/14/2024. 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  He is currently managing diabetes with Soliqua  and metformin . He takes 16 units of Soliqua  daily and uses a Freestyle continuous glucose monitor, although he is dissatisfied with it due to frequent detachment. He checks his blood sugar three to four times daily but has lost his meter recently. He recalls his A1c was around six last year and mentions a previous low of five, which he found too low while on insulin .  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 He also mentions a right testicular lump present for a couple of years, which he has not previously addressed.   He discusses his lifestyle, mentioning regular gym visits and a desire to balance exercise with diet to manage his diabetes better. He reflects on past experiences with low blood sugar symptoms, such as cold sweats and shaking, and acknowledges the need for consistent management of his condition. No smoking or drinking.           Review of Systems     Objective:    Physical Exam Alert and in no distress.  Hemoglobin A1c is 7.9.  Testicular exam does show a 1 cm lesion present on the right testy anteriorly.    X-rays shows no lung issues contrary to his assessment.  He did show evidence of aortic atherosclerosis.      Assessment & Plan:  Assessment and  Plan    Type 2 diabetes mellitus A1c of 7.9 indicates suboptimal control. Previous A1c of 5 was too low for insulin  use. Hypoglycemic episodes reported with insulin  and metformin  without adequate food. Freestyle Libre CGM frequently detaches, considering Dexcom G7. Emphasized maintaining blood glucose in mid to high 6s to avoid hypoglycemia. - Switched to Dexcom G7 CGM. - Educated on maintaining blood glucose levels in the mid to high 6s. - Continue Soliqua , Benicar, metformin .  Right testicular lesion Present for years, ultrasound needed to rule out significant pathology. - Ordered ultrasound of the right testicle. He is to schedule a complete exam in April of next year with me.

## 2024-11-19 ENCOUNTER — Telehealth: Payer: Self-pay | Admitting: Family Medicine

## 2024-11-19 NOTE — Telephone Encounter (Signed)
 Copied from CRM [id number]. Topic: Clinical - Request for Lab/Test Order >> Nov 19, 2024  2:17 PM Amy B wrote: Reason for CRM: [facility] called asking if the ultrasound scrotum is with or without Doppler.  Please call [place] [phone] option 1, then option 5.

## 2024-11-19 NOTE — Telephone Encounter (Signed)
 Called [facility] advised of same

## 2024-12-17 ENCOUNTER — Telehealth: Payer: Self-pay

## 2024-12-17 ENCOUNTER — Ambulatory Visit: Payer: Self-pay

## 2024-12-17 ENCOUNTER — Other Ambulatory Visit: Payer: Self-pay | Admitting: Family Medicine

## 2024-12-17 DIAGNOSIS — E1169 Type 2 diabetes mellitus with other specified complication: Secondary | ICD-10-CM

## 2024-12-17 DIAGNOSIS — E119 Type 2 diabetes mellitus without complications: Secondary | ICD-10-CM

## 2024-12-17 MED ORDER — ROSUVASTATIN CALCIUM 20 MG PO TABS: 20.0000 mg | ORAL_TABLET | Freq: Every day | ORAL | 1 refills | Status: AC

## 2024-12-17 NOTE — Telephone Encounter (Signed)
 Copied from CRM #[id number]. Topic: Clinical - Medication Refill >> Dec 17, 2024  9:39 AM Deaijah H wrote: Medication: rosuvastatin  (CRESTOR ) 20 MG tablet/Insulin  Glargine-Lixisenatide (SOLIQUA ) 100-33 UNT-MCG/ML SOPN  Has the patient contacted their pharmacy? No (Agent: If no, request that the patient contact the pharmacy for the refill. If patient does not wish to contact the pharmacy document the reason why and proceed with request.) Not original pharmacy (Agent: If yes, when and what did the pharmacy advise?)  This is the patient's preferred pharmacy:  [facility]  [address] [place] [address] Phone: [phone]   Is this the correct pharmacy for this prescription? Yes If no, delete pharmacy and type the correct one.   Has the prescription been filled recently? No  Is the patient out of the medication? Yes  Has the patient been seen for an appointment in the last year OR does the patient have an upcoming appointment? Yes  Can we respond through MyChart? Yes  Agent: Please be advised that Rx refills may take up to 3 business days. We ask that you follow-up with your pharmacy.

## 2024-12-17 NOTE — Telephone Encounter (Signed)
 Copied from CRM #[id number]. Topic: Referral - Question >> Dec 17, 2024  9:38 AM Deaijah H wrote: Reason for CRM: Patient would like to know if Dr. Joyce could send a referral for MRI to somewhere in [place]. >> Dec 17, 2024  9:42 AM Deaijah H wrote: Patient would like to be contacted once new referral has been sent

## 2024-12-17 NOTE — Telephone Encounter (Signed)
 FYI Only or Action Required?: FYI only for provider: appointment scheduled on 12/18/24.  Patient was last seen in primary care on 11/17/2024 by Joyce Norleen BROCKS, MD.  Called Nurse Triage reporting Hyperglycemia.  Symptoms began several days ago.  Interventions attempted: Prescription medications: metformin .  Symptoms are: gradually worsening.  Triage Disposition: Call PCP Now  Patient/caregiver understands and will follow disposition?: Yes               Copied from CRM #[id number]. Topic: Clinical - Red Word Triage >> Dec 17, 2024  2:03 PM Donna BRAVO wrote: Red Word that prompted transfer to Nurse Triage: Harlene spouse calling not on DPR patient is sleeping  diabetic  Symptoms -extreme fatigue deep sleep -lingering headache  -Glucose 254 at 12/16/24 last night -Glucose 12/17/24 at  8:00am 309     Glucose 12/17/24 at 2:05pm 254 Reason for Disposition  Blood glucose > 400 mg/dL (77.7 mmol/L)  Answer Assessment - Initial Assessment Questions Patient speaking with triage RN. No slurred speech or confusion noted, patient answering questions appropriately.   1. BLOOD GLUCOSE: What is your blood glucose level?      259.  2. ONSET: When did you check the blood glucose?     Current now. He states the last 3 days it has been high, he states he didn't have his CGM hooked up and when he replaced it, there was a reading in the 400s. Multiple readings over 300 over the past 3 days.  3. USUAL RANGE: What is your glucose level usually? (e.g., usual fasting morning value, usual evening value)     200s.  4. KETONES: Do you check for ketones (urine or blood test strips)? If Yes, ask: What does the test show now?      No.  5. TYPE 1 or 2:  Do you know what type of diabetes you have?  (e.g., Type 1, Type 2, Gestational; doesn't know)      Type 2.  6. INSULIN : Do you take insulin ? What type of insulin (s) do you use? What is the mode of delivery? (syringe, pen;  injection or pump)?      Yes. Soliqua , ran out on Saturday (last dose). Medication was refilled today and patient states he won't be able to get it until tomorrow.  7. DIABETES PILLS: Do you take any pills for your diabetes? If Yes, ask: Have you missed taking any pills recently?     Yes, metformin . No missed noses.  8. OTHER SYMPTOMS: Do you have any symptoms? (e.g., fever, frequent urination, difficulty breathing, dizziness, weakness, vomiting)     Frequent urination, sluggish/increased sleepiness and tired/fatigue brain fog. No fever, vomiting, confusion. Patient states only SOB if going up and down the stairs. At the end of triage, RN reviewing care advice and patient states he has downplayed his symptoms and he actually feels SOB more often and states for example when he goes to urinate he feels SOB and can feel his heart rate get faster. He states his wife would like him to come in and see a provider.  Protocols used: Diabetes - High Blood Sugar-A-AH

## 2024-12-18 ENCOUNTER — Encounter: Payer: Self-pay | Admitting: Family Medicine

## 2024-12-18 ENCOUNTER — Ambulatory Visit: Admitting: Family Medicine

## 2024-12-18 ENCOUNTER — Other Ambulatory Visit: Payer: Self-pay

## 2024-12-18 DIAGNOSIS — E119 Type 2 diabetes mellitus without complications: Secondary | ICD-10-CM

## 2024-12-21 ENCOUNTER — Other Ambulatory Visit (HOSPITAL_COMMUNITY): Payer: Self-pay

## 2024-12-22 ENCOUNTER — Telehealth: Payer: Self-pay | Admitting: Pharmacy Technician

## 2024-12-22 ENCOUNTER — Other Ambulatory Visit (HOSPITAL_COMMUNITY): Payer: Self-pay

## 2024-12-22 NOTE — Telephone Encounter (Signed)
 Pharmacy Patient Advocate Encounter   Received notification from Onbase that prior authorization for Soliqua  100-33UNT-MCG/ML pen-injectors is required/requested.   Insurance verification completed.   The patient is insured through [employer].   Per test claim: PA required; PA submitted to above mentioned insurance via Latent Key/confirmation #/EOC [id number] Status is pending

## 2024-12-22 NOTE — Telephone Encounter (Signed)
 Pharmacy Patient Advocate Encounter  Received notification from [employer] that Prior Authorization for Soliqua  100-33UNT-MCG/ML pen-injectors has been APPROVED from 12/22/2024 to 12/22/2025. Ran test claim, Copay is $0.00. This test claim was processed through [facility]- copay amounts may vary at other pharmacies due to pharmacy/plan contracts, or as the patient moves through the different stages of their insurance plan.   PA #/Case ID/Reference #: [id number]

## 2025-04-01 ENCOUNTER — Encounter: Admitting: Family Medicine
